# Patient Record
Sex: Female | Born: 1960 | Race: White | Hispanic: No | Marital: Married | Smoking: Current every day smoker
Health system: Southern US, Community
[De-identification: ages and names within clinical notes are randomized; demographics above are authoritative.]

## PROBLEM LIST (undated history)

## (undated) DIAGNOSIS — N879 Dysplasia of cervix uteri, unspecified: Secondary | ICD-10-CM

## (undated) DIAGNOSIS — I10 Essential (primary) hypertension: Secondary | ICD-10-CM

## (undated) DIAGNOSIS — E7800 Pure hypercholesterolemia, unspecified: Secondary | ICD-10-CM

## (undated) DIAGNOSIS — N83209 Unspecified ovarian cyst, unspecified side: Secondary | ICD-10-CM

## (undated) DIAGNOSIS — F419 Anxiety disorder, unspecified: Secondary | ICD-10-CM

## (undated) HISTORY — PX: TYMPANOSTOMY TUBE PLACEMENT: SHX32

## (undated) HISTORY — DX: Anxiety disorder, unspecified: F41.9

## (undated) HISTORY — PX: PELVIC LAPAROSCOPY: SHX162

## (undated) HISTORY — DX: Dysplasia of cervix uteri, unspecified: N87.9

## (undated) HISTORY — DX: Pure hypercholesterolemia, unspecified: E78.00

## (undated) HISTORY — PX: COLPOSCOPY: SHX161

## (undated) HISTORY — DX: Unspecified ovarian cyst, unspecified side: N83.209

## (undated) HISTORY — PX: OTHER SURGICAL HISTORY: SHX169

## (undated) HISTORY — PX: GYNECOLOGIC CRYOSURGERY: SHX857

---

## 1997-12-12 ENCOUNTER — Other Ambulatory Visit: Admission: RE | Admit: 1997-12-12 | Discharge: 1997-12-12 | Payer: Self-pay | Admitting: *Deleted

## 1998-12-26 ENCOUNTER — Other Ambulatory Visit: Admission: RE | Admit: 1998-12-26 | Discharge: 1998-12-26 | Payer: Self-pay | Admitting: *Deleted

## 1999-01-10 ENCOUNTER — Encounter: Payer: Self-pay | Admitting: *Deleted

## 1999-01-10 ENCOUNTER — Ambulatory Visit (HOSPITAL_COMMUNITY): Admission: RE | Admit: 1999-01-10 | Discharge: 1999-01-10 | Payer: Self-pay | Admitting: *Deleted

## 1999-12-07 ENCOUNTER — Ambulatory Visit (HOSPITAL_COMMUNITY): Admission: RE | Admit: 1999-12-07 | Discharge: 1999-12-07 | Payer: Self-pay | Admitting: *Deleted

## 1999-12-31 ENCOUNTER — Other Ambulatory Visit: Admission: RE | Admit: 1999-12-31 | Discharge: 1999-12-31 | Payer: Self-pay | Admitting: Obstetrics and Gynecology

## 2000-02-12 HISTORY — PX: ABDOMINAL SURGERY: SHX537

## 2000-02-12 HISTORY — PX: OOPHORECTOMY: SHX86

## 2000-07-14 ENCOUNTER — Encounter (INDEPENDENT_AMBULATORY_CARE_PROVIDER_SITE_OTHER): Payer: Self-pay | Admitting: Specialist

## 2000-07-14 ENCOUNTER — Inpatient Hospital Stay (HOSPITAL_COMMUNITY): Admission: RE | Admit: 2000-07-14 | Discharge: 2000-07-16 | Payer: Self-pay | Admitting: Obstetrics and Gynecology

## 2000-12-13 ENCOUNTER — Inpatient Hospital Stay (HOSPITAL_COMMUNITY): Admission: AD | Admit: 2000-12-13 | Discharge: 2000-12-13 | Payer: Self-pay | Admitting: Obstetrics and Gynecology

## 2001-01-08 ENCOUNTER — Inpatient Hospital Stay (HOSPITAL_COMMUNITY): Admission: AD | Admit: 2001-01-08 | Discharge: 2001-01-08 | Payer: Self-pay | Admitting: Obstetrics and Gynecology

## 2001-01-09 ENCOUNTER — Inpatient Hospital Stay (HOSPITAL_COMMUNITY): Admission: AD | Admit: 2001-01-09 | Discharge: 2001-01-09 | Payer: Self-pay | Admitting: Obstetrics and Gynecology

## 2001-01-12 ENCOUNTER — Inpatient Hospital Stay (HOSPITAL_COMMUNITY): Admission: AD | Admit: 2001-01-12 | Discharge: 2001-01-12 | Payer: Self-pay | Admitting: Obstetrics and Gynecology

## 2001-03-08 ENCOUNTER — Inpatient Hospital Stay (HOSPITAL_COMMUNITY): Admission: AD | Admit: 2001-03-08 | Discharge: 2001-03-08 | Payer: Self-pay | Admitting: Obstetrics and Gynecology

## 2001-04-18 ENCOUNTER — Inpatient Hospital Stay (HOSPITAL_COMMUNITY): Admission: AD | Admit: 2001-04-18 | Discharge: 2001-04-18 | Payer: Self-pay | Admitting: Gynecology

## 2001-05-13 ENCOUNTER — Other Ambulatory Visit: Admission: RE | Admit: 2001-05-13 | Discharge: 2001-05-13 | Payer: Self-pay | Admitting: Obstetrics and Gynecology

## 2001-06-10 ENCOUNTER — Encounter: Admission: RE | Admit: 2001-06-10 | Discharge: 2001-09-08 | Payer: Self-pay | Admitting: Obstetrics and Gynecology

## 2001-07-30 ENCOUNTER — Ambulatory Visit (HOSPITAL_COMMUNITY): Admission: RE | Admit: 2001-07-30 | Discharge: 2001-07-30 | Payer: Self-pay | Admitting: Obstetrics and Gynecology

## 2001-07-30 ENCOUNTER — Encounter: Payer: Self-pay | Admitting: Obstetrics and Gynecology

## 2002-12-21 ENCOUNTER — Ambulatory Visit (HOSPITAL_COMMUNITY): Admission: RE | Admit: 2002-12-21 | Discharge: 2002-12-21 | Payer: Self-pay | Admitting: Obstetrics and Gynecology

## 2003-02-17 ENCOUNTER — Other Ambulatory Visit: Admission: RE | Admit: 2003-02-17 | Discharge: 2003-02-17 | Payer: Self-pay | Admitting: Obstetrics and Gynecology

## 2004-05-10 ENCOUNTER — Ambulatory Visit (HOSPITAL_COMMUNITY): Admission: RE | Admit: 2004-05-10 | Discharge: 2004-05-10 | Payer: Self-pay | Admitting: Obstetrics and Gynecology

## 2004-05-23 ENCOUNTER — Other Ambulatory Visit: Admission: RE | Admit: 2004-05-23 | Discharge: 2004-05-23 | Payer: Self-pay | Admitting: Obstetrics and Gynecology

## 2005-05-31 ENCOUNTER — Ambulatory Visit (HOSPITAL_COMMUNITY): Admission: RE | Admit: 2005-05-31 | Discharge: 2005-05-31 | Payer: Self-pay | Admitting: Obstetrics and Gynecology

## 2005-06-06 ENCOUNTER — Other Ambulatory Visit: Admission: RE | Admit: 2005-06-06 | Discharge: 2005-06-06 | Payer: Self-pay | Admitting: Obstetrics and Gynecology

## 2006-08-07 ENCOUNTER — Ambulatory Visit (HOSPITAL_COMMUNITY): Admission: RE | Admit: 2006-08-07 | Discharge: 2006-08-07 | Payer: Self-pay | Admitting: Obstetrics and Gynecology

## 2006-08-14 ENCOUNTER — Other Ambulatory Visit: Admission: RE | Admit: 2006-08-14 | Discharge: 2006-08-14 | Payer: Self-pay | Admitting: Obstetrics and Gynecology

## 2007-01-09 ENCOUNTER — Emergency Department (HOSPITAL_COMMUNITY): Admission: EM | Admit: 2007-01-09 | Discharge: 2007-01-09 | Payer: Self-pay | Admitting: Emergency Medicine

## 2007-12-23 ENCOUNTER — Encounter: Payer: Self-pay | Admitting: Obstetrics and Gynecology

## 2007-12-23 ENCOUNTER — Other Ambulatory Visit: Admission: RE | Admit: 2007-12-23 | Discharge: 2007-12-23 | Payer: Self-pay | Admitting: Obstetrics and Gynecology

## 2007-12-23 ENCOUNTER — Ambulatory Visit: Payer: Self-pay | Admitting: Obstetrics and Gynecology

## 2008-03-01 ENCOUNTER — Ambulatory Visit (HOSPITAL_COMMUNITY): Admission: RE | Admit: 2008-03-01 | Discharge: 2008-03-01 | Payer: Self-pay | Admitting: Obstetrics and Gynecology

## 2008-03-24 ENCOUNTER — Ambulatory Visit: Payer: Self-pay | Admitting: Obstetrics and Gynecology

## 2009-03-06 ENCOUNTER — Ambulatory Visit (HOSPITAL_COMMUNITY): Admission: RE | Admit: 2009-03-06 | Discharge: 2009-03-06 | Payer: Self-pay | Admitting: Obstetrics and Gynecology

## 2009-09-21 ENCOUNTER — Other Ambulatory Visit: Admission: RE | Admit: 2009-09-21 | Discharge: 2009-09-21 | Payer: Self-pay | Admitting: Obstetrics and Gynecology

## 2009-09-21 ENCOUNTER — Ambulatory Visit: Payer: Self-pay | Admitting: Obstetrics and Gynecology

## 2010-04-02 ENCOUNTER — Other Ambulatory Visit: Payer: Self-pay | Admitting: Obstetrics and Gynecology

## 2010-04-02 DIAGNOSIS — Z1231 Encounter for screening mammogram for malignant neoplasm of breast: Secondary | ICD-10-CM

## 2010-04-09 ENCOUNTER — Ambulatory Visit (HOSPITAL_COMMUNITY): Payer: BC Managed Care – PPO

## 2010-04-16 ENCOUNTER — Ambulatory Visit (HOSPITAL_COMMUNITY)
Admission: RE | Admit: 2010-04-16 | Discharge: 2010-04-16 | Disposition: A | Discharge status: Home / Self Care | Payer: BC Managed Care – PPO | Source: Ambulatory Visit | Attending: Obstetrics and Gynecology | Admitting: Obstetrics and Gynecology

## 2010-04-16 DIAGNOSIS — Z1231 Encounter for screening mammogram for malignant neoplasm of breast: Secondary | ICD-10-CM | POA: Insufficient documentation

## 2010-06-29 NOTE — Discharge Summary (Signed)
[facility]  Patient:    Adrienne [name], Adrienne [name]                    MRN: [id number] Adm. Date:  [id number] Disc. Date: [id number] Attending:  Sharon Mt                           Discharge Summary  HISTORY OF PRESENT ILLNESS:   The patient is a 50 year old female who was admitted to the hospital with stage IV endometriosis and pelvic pain for surgery.  HOSPITAL COURSE:              On the day of admission, she was taken to the operating room.  An exploratory laparotomy, lysis of adhesions, left salpingo-oophorectomy, right ovarian cystectomy, and excision of endometriosis were all performed.  Postoperatively, she had an ileus on the first postoperative day.  It responded to restricting fluids and IVs.  By the second postoperative day, she was voiding well, passing gas, and was ready for discharge.  DISCHARGE DIET:               Regular.  DISCHARGE ACTIVITY:           Ambulatory.  DISCHARGE MEDICATIONS:        Tylox and Premarin 0.625 mg one daily to take because of the Lupron Depot we were treating her with postoperatively.  She had gotten an injection of 3.75 mg on July 15, 2000.  FOLLOW-UP:                    She will return to the office in 24 hours for staple removal.  LABORATORY DATA:              The final pathology report is not available at the time of dictation.  DISCHARGE DIAGNOSIS:          Stage IV endometriosis with pelvic pain.  OPERATIONS:                   1. Exploratory laparotomy.                               2. Left salpingo-oophorectomy.                               3. Right ovarian cystectomy.                               4. Lysis of pelvic adhesions. DD:  07/16/00 TD:  07/16/00 Job: [id number]

## 2010-06-29 NOTE — Op Note (Signed)
[facility]  Patient:    Adrienne [name], Adrienne [name]                    MRN: [id number] Proc. Date: 07/14/00 Adm. Date:  [id number] Attending:  Sharon Mt                           Operative Report  PREOPERATIVE DIAGNOSES:       1. Pelvic pain.                               2. Stage IV endometriosis.  POSTOPERATIVE DIAGNOSES:      1. Pelvic pain.                               2. Stage IV endometriosis.  OPERATION:                    Exploratory laparotomy with lysis of                               pelvic adhesions, left salpingo-oophorectomy,                               excision of endometrium of right ovary,                               excision of endometriosis.  SURGEON:                      Daniel L. Eda Paschal, M.D.  ASSISTANTNadyne Coombes. Fontaine, M.D.  ANESTHESIA:  ESTIMATED BLOOD LOSS:         250 cc.  FINDINGS:                     At the time of laparotomy, the patient had a completely obliterated cul-de-sac from endometriosis. Both adnexa were densely adherent into the cul-de-sac by endometriosis.  Once they were freed up, the left adnexa contained a 4 to 5 cm endometrioma.  There was almost no free ovarian tissue that was not involved with endometriosis.  Blood supply was significantly altered by freeing up the left adnexa.  Left fallopian tube had fimbria but was not a normal left fallopian tube. On the right side, there was a 1 cm endometrioma.  It was surface endometriosis. There were pelvic adhesions; however, when all the above was freed up, she had a much healthier right ovary.  There was endometriosis in the cul-de-sac. There was also endometriosis in the vesicouterine fold of peritoneum including nodules of endometriosis. The ileocecal junction was identified and the patient had a normal appendix.  The fallopian tube on the right was normal with luxuriant fimbria.  DESCRIPTION OF PROCEDURE:     After  adequate general endotracheal anesthesia, the patient was placed in the supine position, prepped and draped in the usual sterile manner.  A Foley catheter was inserted in the patients bladder.  A Pfannenstiel incision was made.  The fascia was opened transversely. The peritoneum was entered vertically.  From this point on, microsurgical technique was utilized, first adhesions were lysed to free up the adnexa, then the endometrioma of the right ovary was excised using the Morton microelectrode.  Copious irrigation was done with Ringers lactate through an irrigator rather than using sponges.  All surface endometriosis on the right adnexa was freed up and the right ovarian capsule was closed with a running 4-0 Prolene burying the knot.  ON the left side there was a much different story.  The adnexa was densely adherent to the cul-de-sac and to the broad ligament and as it was freed up, vascularity was compromised significantly and the tissue that was left other than the endometrioma was also very poor tissue and did not appear salvageable. As had been discussed with the patient preoperatively, it was felt at this point a left salpingo-oophorectomy would serve her better in terms of preventing recurrent disease.  The round ligament on the left was clamped, cut, and suture ligated with 0 Vicryl.  The retroperitoneal area was then entered on the left.  Ureter was identified. The infundibulopelvic ligament was clamped, cut, and doubly suture ligated with 0 Vicryl as was the utero-ovarian ligament and fallopian tube.  The adnexa was removed.  The capsule had to be removed in pieces to completely remove it plus the endometriosis. There was some oozing that was controlled with the Bovie without compromising the ureter. Finally all the bleeding had stopped. The cul-de-sac endometriosis was also sharply excised. The endometriosis on the vesicouterine fold of peritoneum was sharply incised without  entering the bladder and nodules of endometriosis were removed.  The ileocecal junction was identified and the appendix was normal.  Finally all the above had been accomplished.  Copious irrigation was done with Ringers lactate. There was no further bleeding.  The peritoneum was closed with a running 0 Vicryl.  Prior to completely closing the peritoneum, ____________ was placed in the peritoneum to prevent adhesion formation.  Copious irrigation was done sub- and superfascially with Ringers lactate and the fascia was closed with two running 0 Vicryls.  The skin was closed with staples.  The estimated blood loss for the entire procedure was 250 cc with none replaced.  The patient tolerated the procedure well and left the operating room in satisfactory condition draining clear urine from her Foley catheter. DD:  07/14/00 TD:  07/14/00 Job: [id number]

## 2010-06-29 NOTE — H&P (Signed)
[facility] of [facility]  Patient:    Adrienne [name], Adrienne [name]                    MRN: [id number] Adm. Date:  [id number] Attending:  Donne Hazel                         History and Physical  HISTORY OF PRESENT ILLNESS:   [name] is a 50 year old female, gravida 0 admitted for laparoscopy due to recurrent pelvic pain and recurrent ovarian cyst.  The patient has had multiple emergency room visits for severe pelvic pain, is admitted for evaluation and treatment of this pain.  She has been trying to conceive for quite a while.  She has been unsuccessful despite some effort in fertility.  However, her admission is most notably for her severe pelvic pain.  PAST MEDICAL HISTORY:         1. History of recurrent chronic and acute pelvic                                  pain.                               2. Mitral valve prolapse.  PAST SURGICAL HISTORY:        1. Ear surgery x 3.                               2. Neck surgery x 1.  CURRENT MEDICATIONS:          Xanax p.r.n.  ALLERGIES:                    None known.  PHYSICAL EXAMINATION:  VITAL SIGNS:                  Stable, temperature 97, pulse 80, respirations 16, blood pressure 127/79.  GENERAL:                      She is a well-developed, well-nourished female in no acute distress.  HEENT:                        Within normal limits.  NECK:                         Supple without adenopathy or thyromegaly.  HEART:                        Regular rate and rhythm without murmur, gallop, or rub.  LUNGS:                        Clear to auscultation.  BREASTS:                      Examination done recently in the office was normal but is deferred upon admission.  ABDOMEN:                      Benign without masses, tenderness, organomegaly, or hernia.  EXTREMITIES:                  Grossly  normal.  NEUROLOGICAL:                 Grossly normal.  PELVIC:                       Examination normal  external female genitalia. Vagina, cervix clear.  The uterus is small, nontender and mobile.  The adnexa is clear of mass.  ADMISSION DIAGNOSES:          1. Chronic and acute pelvic pain.                               2. History of recurrent ovarian cyst.  PLAN:                         Diagnostic laparoscopy.  DISCUSSION:                   The risks and benefits of surgery were explained to the patient.  The risks of bleeding, infection, risk of injury to surrounding organs explained.  The patient was allowed to ask questions and wished to proceed with diagnostic laparoscopy. DD:  12/07/99 TD:  12/07/99 Job: [id number]

## 2010-06-29 NOTE — H&P (Signed)
[facility] of [facility]  Patient:    Adrienne [name], Adrienne [name]                      MRN: [id number] Attending:  Rande Brunt. Eda Paschal, M.D.                         History and Physical  CHIEF COMPLAINT:              Pelvic pain.  HISTORY OF PRESENT ILLNESS:   The patient is a 50 year old gravida 1, para 0, AB 1 with a 2-3 year history of progressively increasing pelvic pain.  It usually occurs mid cycle.  She has had recurrent ovarian cysts associated with it that seem to come and go but it seems to be there even when she is not having it.  Her periods are every 31-32 days.  She is having significant dysmenorrhea but not having any dyspareunia.  On examination, she has significant nodularity of her uterosacral ligaments.  She had undergone a previous laparoscopy by Dr. Malachy Mood in October 2001 and was found to have a stage IV endometriosis.  She now enters the hospital for conservative surgery for the above because she would like to conceive.  She will not undergo a hysterectomy.  She has, however, given me permission to do a unilateral salpingo-oophorectomy if I think that is appropriate.  Ultrasound done last week does show a 4 cm cyst of her left ovary that could be consistent with an endometrioma and a 1 cm cyst of her right ovary, also consistent with a small endometrioma.  PAST MEDICAL HISTORY:         Laparoscopy in 2001.  CURRENT MEDICATIONS:          Nonsteroidal anti-inflammatory drugs.  ALLERGIES:                    None.  FAMILY HISTORY:               She is adopted, therefore no family history obtainable.  SOCIAL HISTORY:               She does smoke and does drink alcohol in moderation.  REVIEW OF SYSTEMS:            HEENT:  Negative.  CARDIAC:  Palpitations and she has a history of mitral valve prolapse.  GI:  Negative.  RESPIRATORY: Negative.  GU:  Negative.  NEUROMUSCULAR:  Negative.  ENDOCRINE:  Negative.  PHYSICAL EXAMINATION:  GENERAL:                       The patient is a well-developed and well-nourished female in no acute distress.  VITAL SIGNS:                  Blood pressure 130/70, pulse 80 and regular, respirations 16 and nonlabored.  She is afebrile.  HEENT:                        All within normal limits.  NECK:                         Supple.  Trachea in the midline.  Thyroid is not enlarged.  LUNGS:                        Clear to P&A.  HEART:                        No thrills, heaves, or murmurs.  BREASTS:                      No masses.  ABDOMEN:                      Soft without guarding, rebound, or masses.  PELVIC:                       External and vaginal within normal limits. Cervix is clean.  Pap smear shows no atypia.  Uterus is mid position.  Normal size and shape.  Somewhat fixed with significant uterosacral nodularity bilaterally.  Adnexa are difficult to examine due to tenderness but she does have a cyst on her left ovary on ultrasound.  Rectovaginal is confirmatory.  EXTREMITIES:                  Within normal limits.  IMPRESSION:                   Pelvic pain with stage IV endometriosis.  PLAN:                         Conservative surgery for the above. DD:  07/14/00 TD:  07/14/00 Job: [id number]

## 2010-06-29 NOTE — Op Note (Signed)
[facility]  Patient:    Adrienne [name], Adrienne [name]                    MRN: [id number] Proc. Date: 12/07/99 Adm. Date:  [id number] Disc. Date: [id number] Attending:  Donne Hazel                           Operative Report  PREOPERATIVE DIAGNOSES:       1. Chronic and acute pelvic pain.                               2. Recurrent ovarian cyst.  POSTOPERATIVE DIAGNOSES:      1. Severe pelvic adhesions.                               2. Endometriosis.  PROCEDURE:                    Laparoscopy.  SURGEON:                      Willey Blade, M.D.  ANESTHESIA:                   General endotracheal.  ESTIMATED BLOOD LOSS:         Less than 20 cc.  COMPLICATIONS:                None.  FINDINGS:                     At the time of laparoscopy, severe pelvic adhesions were noted.  The posterior cul-de-sac was especially diseased, with dense scar tissue.  It was decided at this time not to undergo a dissection of the pelvis, due to the severity of the illness.  A few representative pictures were taken regarding her pelvis.  Basically her posterior cul-de-sac was obliterated from adhesive disease.  DESCRIPTION OF PROCEDURE:     The patient was taken to the operating room where a general endotracheal anesthetic was administered.  The patient was placed on the operating room table in the dorsal lithotomy position.  The abdomen, perineum, and vagina were prepped and draped in the usual sterile fashion with Betadine and sterile drapes.  A red rubber catheter was used to empty the bladder.  Next, a small vertical infraumbilical skin incision was made through which a Veress needle was inserted atraumatically into the abdominal cavity.  The abdomen was insufflated with 2 L of carbon dioxide gas. The Veress needle was removed and a disposable laparoscopic trocar was inserted atraumatically into the abdominal cavity.  A laparoscope was then inserted with the  above-noted findings.  An accessory incision was made two finger breadths above the pubic symphysis and in the midline.  Through this a 5.0 mm trocar was placed atraumatically.  On this particular examination of the pelvis, severe pelvic adhesions were noted.  There was endometriosis apparent, but not biopsied.  The pelvis was in such condition that dissection was not carried out.  A few representative pictures were obtained.  One area on the left was cauterized, due to its hemorrhagic appearance.  All abdominal instruments were removed and the gas was then allowed to escape from the abdomen.  The two small incisions were then closed, first #0 Vicryl on  the umbilical site on the muscle fascia, and the skin was reapproximated with multiple interrupted sutures of #3-0 Vicryl Rapide.  A Hulka tenaculum was placed at the time of the prep, and this was removed at the end of the procedure.  The patient was then awakened, extubated, and taken to the recovery room in good condition.  There were no perioperative complications. DD:  12/28/99 TD:  12/28/99 Job: [id number]

## 2010-11-20 LAB — CBC
HCT: 39.4
Hemoglobin: 13.8
MCV: 96.6
Platelets: 335
RDW: 13.8

## 2010-11-20 LAB — DIFFERENTIAL
Lymphocytes Relative: 32
Lymphs Abs: 3.3
Monocytes Absolute: 0.7
Monocytes Relative: 7
Neutro Abs: 5.8

## 2010-11-20 LAB — URINALYSIS, ROUTINE W REFLEX MICROSCOPIC
Nitrite: NEGATIVE
Protein, ur: NEGATIVE
Urobilinogen, UA: 0.2

## 2010-11-20 LAB — COMPREHENSIVE METABOLIC PANEL
ALT: 26
AST: 29
Albumin: 4
Alkaline Phosphatase: 66
Chloride: 97
Creatinine, Ser: 0.99
Glucose, Bld: 101 — ABNORMAL HIGH
Potassium: 4.1
Total Bilirubin: 0.8

## 2010-11-22 DIAGNOSIS — G43009 Migraine without aura, not intractable, without status migrainosus: Secondary | ICD-10-CM | POA: Insufficient documentation

## 2010-11-22 DIAGNOSIS — F419 Anxiety disorder, unspecified: Secondary | ICD-10-CM | POA: Insufficient documentation

## 2010-12-05 ENCOUNTER — Encounter: Payer: Self-pay | Admitting: Obstetrics and Gynecology

## 2010-12-05 ENCOUNTER — Ambulatory Visit (INDEPENDENT_AMBULATORY_CARE_PROVIDER_SITE_OTHER): Payer: BC Managed Care – PPO | Admitting: Obstetrics and Gynecology

## 2010-12-05 ENCOUNTER — Other Ambulatory Visit (HOSPITAL_COMMUNITY)
Admission: RE | Admit: 2010-12-05 | Discharge: 2010-12-05 | Disposition: A | Discharge status: Home / Self Care | Payer: BC Managed Care – PPO | Source: Ambulatory Visit | Attending: Obstetrics and Gynecology | Admitting: Obstetrics and Gynecology

## 2010-12-05 VITALS — BP 140/90 | Ht 67.0 in | Wt 168.0 lb

## 2010-12-05 DIAGNOSIS — Z780 Asymptomatic menopausal state: Secondary | ICD-10-CM

## 2010-12-05 DIAGNOSIS — N83209 Unspecified ovarian cyst, unspecified side: Secondary | ICD-10-CM | POA: Insufficient documentation

## 2010-12-05 DIAGNOSIS — N951 Menopausal and female climacteric states: Secondary | ICD-10-CM

## 2010-12-05 DIAGNOSIS — Z01419 Encounter for gynecological examination (general) (routine) without abnormal findings: Secondary | ICD-10-CM | POA: Insufficient documentation

## 2010-12-05 DIAGNOSIS — N912 Amenorrhea, unspecified: Secondary | ICD-10-CM

## 2010-12-05 DIAGNOSIS — N949 Unspecified condition associated with female genital organs and menstrual cycle: Secondary | ICD-10-CM

## 2010-12-05 DIAGNOSIS — N809 Endometriosis, unspecified: Secondary | ICD-10-CM | POA: Insufficient documentation

## 2010-12-05 DIAGNOSIS — N938 Other specified abnormal uterine and vaginal bleeding: Secondary | ICD-10-CM

## 2010-12-05 DIAGNOSIS — R82998 Other abnormal findings in urine: Secondary | ICD-10-CM

## 2010-12-05 NOTE — Progress Notes (Signed)
See me today for her annual GYN exam. She has a history of endometriosis. She has had menorrhagia for the past several years. We discussed endometrial ablation in the past. She was at the point that she thought she was going to proceed. She then got busy and did not followup. After her periods have been very regular she bled the whole month of August. She then had amenorrhea until last night when she started spotting she still spotting today. She had microscopic hematuria on one sample but when she returned it was negative. Since she's now spotting she is concerned that this will not be a good sample she gave [place]. She is up-to-date on mammograms. She's having no pelvic pain. She thought she might be menopausal because of the amenorrhea, some hot flashes, and several episodes of a migraine aura without an actual headache.  ROS: Negative except as noted above.  Physical examination: Kennon Portela present HEENT within normal limits. Neck: Thyroid not large. No masses. Supraclavicular nodes: not enlarged. Breasts: Examined in both sitting midline position. No skin changes and no masses. Abdomen: Soft no guarding rebound or masses or hernia. Pelvic: External: Within normal limits. BUS: Within normal limits. Vaginal:within normal limits. Good estrogen effect. No evidence of cystocele rectocele or enterocele. Cervix: clean. Uterus: Normal size and shape. Adnexa: No masses. Rectovaginal exam: Confirmatory and negative. Extremities: Within normal limits.  Assessment: #1. Menorrhagia #2. Dysfunctional uterine bleeding.  Plan: Endometrial biopsy obtained. FSH drawn. We will inform patient with results. Patient would like to proceed with endometrial ablation if menorrhagia persists. Patient is not using birth control because of infertility but I warned her today that she is taking some risk and she would be safer using condoms.

## 2010-12-12 MED ORDER — MEDROXYPROGESTERONE ACETATE 10 MG PO TABS: 10.0000 mg | ORAL_TABLET | Freq: Every day | ORAL | Status: DC

## 2010-12-12 NOTE — Progress Notes (Signed)
Addended by: Richardson Chiquito on: 12/12/2010 01:16 PM   Modules accepted: Orders

## 2011-09-09 ENCOUNTER — Other Ambulatory Visit: Payer: Self-pay | Admitting: Obstetrics and Gynecology

## 2011-09-09 DIAGNOSIS — Z1231 Encounter for screening mammogram for malignant neoplasm of breast: Secondary | ICD-10-CM

## 2011-09-26 ENCOUNTER — Ambulatory Visit (HOSPITAL_COMMUNITY)
Admission: RE | Admit: 2011-09-26 | Discharge: 2011-09-26 | Disposition: A | Discharge status: Home / Self Care | Payer: BC Managed Care – PPO | Source: Ambulatory Visit | Attending: Obstetrics and Gynecology | Admitting: Obstetrics and Gynecology

## 2011-09-26 DIAGNOSIS — Z1231 Encounter for screening mammogram for malignant neoplasm of breast: Secondary | ICD-10-CM | POA: Insufficient documentation

## 2011-11-15 ENCOUNTER — Emergency Department (HOSPITAL_COMMUNITY)
Admission: EM | Admit: 2011-11-15 | Discharge: 2011-11-15 | Disposition: A | Discharge status: Home / Self Care | Payer: BC Managed Care – PPO | Attending: Emergency Medicine | Admitting: Emergency Medicine

## 2011-11-15 ENCOUNTER — Encounter (HOSPITAL_COMMUNITY): Payer: Self-pay

## 2011-11-15 DIAGNOSIS — F17200 Nicotine dependence, unspecified, uncomplicated: Secondary | ICD-10-CM | POA: Insufficient documentation

## 2011-11-15 DIAGNOSIS — E7800 Pure hypercholesterolemia, unspecified: Secondary | ICD-10-CM | POA: Insufficient documentation

## 2011-11-15 DIAGNOSIS — F411 Generalized anxiety disorder: Secondary | ICD-10-CM | POA: Insufficient documentation

## 2011-11-15 DIAGNOSIS — G43109 Migraine with aura, not intractable, without status migrainosus: Secondary | ICD-10-CM

## 2011-11-15 NOTE — ED Provider Notes (Signed)
History     CSN: [id number]  Arrival date & time 11/15/11  1228   First MD Initiated Contact with Patient 11/15/11 1323      Chief Complaint  Patient presents with  . Aphasia    (Consider location/radiation/quality/duration/timing/severity/associated sxs/prior treatment) The history is provided by the patient.   patient had an episode lasted about 10 minutes where she could not speak. She states she had trouble getting the words out and may have had some trouble thinking of them. She has a history of migraine auras. She states that she take a Xanax and some ibuprofen and she does not have a headache. She's had a previous episode with the aura that she got difficulty speaking also. She feels much better now. She states her blood pressure was elevated. No localizing numbness or weakness. She is now at her baseline.  Past Medical History  Diagnosis Date  . Migraine with aura 12/2006    COULD NOT SPEAK, pt has aura without migraines  . Anxiety     EFFEXOR-DR. FRED WILSON  . Elevated cholesterol   . Ovarian cyst   . Endometriosis     Past Surgical History  Procedure Date  . Bronchial cleft cyst   . Pelvic laparoscopy   . Abdominal surgery 2002    EXPLORATORY LAPAROTOMY, LSO , RIGHT OV CYSTECTOMY  . Oophorectomy 2002    LSO, R OVARIAN CYSTECTOMY, EXPLOR LAPAROT  . Tympanostomy tube placement     Family History  Problem Relation Age of Onset  . Adopted: Yes    History  Substance Use Topics  . Smoking status: Current Every Day Smoker -- 0.5 packs/day    Types: Cigarettes  . Smokeless tobacco: Not on file  . Alcohol Use: 2.5 oz/week    5 drink(s) per week    OB History    Grav Para Term Preterm Abortions TAB SAB Ect Mult Living   1 0   1     0      Review of Systems  Constitutional: Negative for chills.  HENT: Negative for neck stiffness.   Respiratory: Negative for chest tightness.   Cardiovascular: Negative for chest pain.  Musculoskeletal: Negative for  arthralgias.  Neurological: Positive for speech difficulty. Negative for light-headedness, numbness and headaches.  Hematological: Negative for adenopathy.    Allergies  Morphine and related  Home Medications   Current Outpatient Rx  Name Route Sig Dispense Refill  . ALPRAZOLAM 0.25 MG PO TABS Oral Take 0.25 mg by mouth 3 (three) times daily as needed. For anxiety.    . IBUPROFEN 200 MG PO TABS Oral Take 400 mg by mouth every 8 (eight) hours as needed. For pain.    . ADULT MULTIVITAMIN W/MINERALS CH Oral Take 1 tablet by mouth daily.    . VENLAFAXINE HCL ER 75 MG PO CP24 Oral Take 75 mg by mouth daily.      BP 143/97  Pulse 85  Temp 97.5 F (36.4 C) (Oral)  Resp 20  Ht 5\' 8"  (1.727 m)  Wt 160 lb (72.576 kg)  BMI 24.33 kg/m2  SpO2 100%  LMP 09/13/2011  Physical Exam  Constitutional: She is oriented to person, place, and time. She appears well-developed and well-nourished.  HENT:  Head: Normocephalic and atraumatic.  Eyes: Pupils are equal, round, and reactive to light.  Neck: Normal range of motion. Neck supple.  Cardiovascular: Normal rate and regular rhythm.   Pulmonary/Chest: Effort normal and breath sounds normal.  Abdominal: Soft.  Musculoskeletal: Normal  range of motion.  Neurological: She is alert and oriented to person, place, and time. No cranial nerve deficit.  Skin: Skin is warm and dry.    ED Course  Procedures (including critical care time)  Labs Reviewed - No data to display No results found.   1. Complicated migraine       MDM  Patient with difficulty speaking this resolved. History of the same and was diagnosed a couple of migraine. She did have her typical aura no headache. She's back at her baseline now. The stomach out as a CVA. She's back to her baseline and will be followed up as needed with neurology.        Juliet Rude. Rubin Payor, MD 11/15/11 1353

## 2011-11-15 NOTE — ED Notes (Signed)
Pt presents tearful, complaining of inability to get her words out correctly, pt also describes aura like she was going to have a migraine but not having headache, pt took xanax x2 today when s/s started, s/s are subsiding now per pt, pt states that this has happened once before 3 years ago, pt had work up and everything was negative.

## 2011-11-15 NOTE — ED Notes (Signed)
RN at  Bedside

## 2011-12-25 ENCOUNTER — Encounter: Payer: Self-pay | Admitting: Obstetrics and Gynecology

## 2011-12-25 ENCOUNTER — Ambulatory Visit (INDEPENDENT_AMBULATORY_CARE_PROVIDER_SITE_OTHER): Payer: BC Managed Care – PPO | Admitting: Obstetrics and Gynecology

## 2011-12-25 ENCOUNTER — Other Ambulatory Visit (HOSPITAL_COMMUNITY)
Admission: RE | Admit: 2011-12-25 | Discharge: 2011-12-25 | Disposition: A | Discharge status: Home / Self Care | Payer: BC Managed Care – PPO | Source: Ambulatory Visit | Attending: Obstetrics and Gynecology | Admitting: Obstetrics and Gynecology

## 2011-12-25 VITALS — BP 154/102 | Ht 67.0 in | Wt 169.0 lb

## 2011-12-25 DIAGNOSIS — Z1151 Encounter for screening for human papillomavirus (HPV): Secondary | ICD-10-CM | POA: Insufficient documentation

## 2011-12-25 DIAGNOSIS — N879 Dysplasia of cervix uteri, unspecified: Secondary | ICD-10-CM | POA: Insufficient documentation

## 2011-12-25 DIAGNOSIS — Z01419 Encounter for gynecological examination (general) (routine) without abnormal findings: Secondary | ICD-10-CM

## 2011-12-25 NOTE — Patient Instructions (Signed)
Take Provera for 5 days.

## 2011-12-25 NOTE — Progress Notes (Signed)
Patient came to see me today for her annual GYN exam. She continues to have oligomenorrhea. Her last menstrual cycle was in August. Last year she went extend the time without a cycle and we did an endometrial biopsy to rule out hyperplasia. She did Provera withdrawal and had a cycle. She knew to continue Provera withdrawal when she didn't have a cycle for 60 days but is not done recently. 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 Her Pap smears have been normal yearly since then. Her last Pap smear was 2012. She is up-to-date on mammograms. Her blood pressures Have been elevated on several occasions including today. She had an appointment with Dr. Andrey Campanile which he canceled but she will reschedule within the next week. In 2002 she had a laparotomy with a left salpingo-oophorectomy and a right ovarian cystectomy for endometriosis.  Physical examination:kim gardner present. HEENT within normal limits. Neck: Thyroid not large. No masses. Supraclavicular nodes: not enlarged. Breasts: Examined in both sitting and lying  position. No skin changes and no masses. Abdomen: Soft no guarding rebound or masses or hernia. Pelvic: External: Within normal limits. BUS: Within normal limits. Vaginal:within normal limits. Good estrogen effect. No evidence of cystocele rectocele or enterocele. Cervix: clean. Uterus: Normal size and shape. Adnexa: No masses. Rectovaginal exam: Confirmatory and negative. Extremities: Within normal limits.  Assessment: #1. Hypertension #2. Endometriosis #3. History of cervical dysplasia  Plan: She will see Dr. Andrey Campanile within the week. She will continue yearly mammograms.The new Pap smear guidelines were discussed with the patient. Pap and high-risk HPV typing done.

## 2011-12-26 LAB — URINALYSIS W MICROSCOPIC + REFLEX CULTURE
Bacteria, UA: NONE SEEN
Casts: NONE SEEN
Crystals: NONE SEEN
Glucose, UA: NEGATIVE mg/dL
Hgb urine dipstick: NEGATIVE
Nitrite: NEGATIVE
Specific Gravity, Urine: 1.01 (ref 1.005–1.030)
Urobilinogen, UA: 0.2 mg/dL (ref 0.0–1.0)

## 2012-01-15 ENCOUNTER — Telehealth: Payer: Self-pay | Admitting: *Deleted

## 2012-01-15 DIAGNOSIS — N912 Amenorrhea, unspecified: Secondary | ICD-10-CM

## 2012-01-15 NOTE — Telephone Encounter (Signed)
Pt was seen on 12/25/11 given rx for provera tablet to take for 5 days, she completed medication 2 weeks & 3 days and no cycle yet. Pt asked what is the next step? Please advise

## 2012-01-15 NOTE — Telephone Encounter (Signed)
Pt informed with the below note, pt will come in tomorrow at 3 pm. Order placed.

## 2012-01-15 NOTE — Addendum Note (Signed)
Addended by: Aura Camps on: 01/15/2012 04:00 PM   Modules accepted: Orders

## 2012-01-15 NOTE — Telephone Encounter (Signed)
Have patient come in for [facility].

## 2012-01-16 ENCOUNTER — Other Ambulatory Visit: Payer: BC Managed Care – PPO

## 2012-07-15 ENCOUNTER — Encounter: Payer: Self-pay | Admitting: Women's Health

## 2012-07-15 ENCOUNTER — Ambulatory Visit (INDEPENDENT_AMBULATORY_CARE_PROVIDER_SITE_OTHER): Payer: BC Managed Care – PPO | Admitting: Women's Health

## 2012-07-15 VITALS — BP 124/88

## 2012-07-15 DIAGNOSIS — I10 Essential (primary) hypertension: Secondary | ICD-10-CM | POA: Insufficient documentation

## 2012-07-15 DIAGNOSIS — K649 Unspecified hemorrhoids: Secondary | ICD-10-CM

## 2012-07-15 DIAGNOSIS — N912 Amenorrhea, unspecified: Secondary | ICD-10-CM

## 2012-07-15 MED ORDER — HYDROCORTISONE ACE-PRAMOXINE 2.5-1 % RE CREA: TOPICAL_CREAM | Freq: Three times a day (TID) | RECTAL | Status: DC

## 2012-07-15 NOTE — Progress Notes (Signed)
Patient ID: Adrienne [name], female   DOB: April 18, 1960, 52 y.o.   MRN: [id number] Presents with complaint of small bump at rectal opening for approximately 4 days. Also states amenorrheic for several months, having hot flushes, had an [facility] >1 years ago that was normal. Has used Provera in the past to induce cycles. Denies constipation, vaginal discharge, urinary symptoms, abdominal pain or fever. Was noted to be hypertensive at annual exam 12/2011, now treated for hypertension on Toprol-XL and HCTZ per primary care. Stands a lot with job/teacher.  Exam: Appears well. External genitalia within normal limits, small less than 1 cm firm bump with minimal tenderness noted at rectal opening. Rectal exam no palpable internal hemorrhoids.  Probable small hemorrhoid Amenorrheic  Plan: FSH, if not menopausal will withdrawal with Provera 10 for 10 days. Analpram to 3 times per day for one week if area does not resolve return to office for followup.

## 2012-07-15 NOTE — Patient Instructions (Addendum)
Hemorrhoids Hemorrhoids are swollen veins around the rectum or anus. There are two types of hemorrhoids:   Internal hemorrhoids. These occur in the veins just inside the rectum. They may poke through to the outside and become irritated and painful.  External hemorrhoids. These occur in the veins outside the anus and can be felt as a painful swelling or hard lump near the anus. CAUSES  Pregnancy.   Obesity.   Constipation or diarrhea.   Straining to have a bowel movement.   Sitting for long periods on the toilet.  Heavy lifting or other activity that caused you to strain.  Anal intercourse. SYMPTOMS   Pain.   Anal itching or irritation.   Rectal bleeding.   Fecal leakage.   Anal swelling.   One or more lumps around the anus.  DIAGNOSIS  Your caregiver may be able to diagnose hemorrhoids by visual examination. Other examinations or tests that may be performed include:   Examination of the rectal area with a gloved hand (digital rectal exam).   Examination of anal canal using a small tube (scope).   A blood test if you have lost a significant amount of blood.  A test to look inside the colon (sigmoidoscopy or colonoscopy). TREATMENT Most hemorrhoids can be treated at home. However, if symptoms do not seem to be getting better or if you have a lot of rectal bleeding, your caregiver may perform a procedure to help make the hemorrhoids get smaller or remove them completely. Possible treatments include:   Placing a rubber band at the base of the hemorrhoid to cut off the circulation (rubber band ligation).   Injecting a chemical to shrink the hemorrhoid (sclerotherapy).   Using a tool to burn the hemorrhoid (infrared light therapy).   Surgically removing the hemorrhoid (hemorrhoidectomy).   Stapling the hemorrhoid to block blood flow to the tissue (hemorrhoid stapling).  HOME CARE INSTRUCTIONS   Eat foods with fiber, such as whole grains, beans,  nuts, fruits, and vegetables. Ask your doctor about taking products with added fiber in them (fibersupplements).  Increase fluid intake. Drink enough water and fluids to keep your urine clear or pale yellow.   Exercise regularly.   Go to the bathroom when you have the urge to have a bowel movement. Do not wait.   Avoid straining to have bowel movements.   Keep the anal area dry and clean. Use wet toilet paper or moist towelettes after a bowel movement.   Medicated creams and suppositories may be used or applied as directed.   Only take over-the-counter or prescription medicines as directed by your caregiver.   Take warm sitz baths for 15 20 minutes, 3 4 times a day to ease pain and discomfort.   Place ice packs on the hemorrhoids if they are tender and swollen. Using ice packs between sitz baths may be helpful.   Put ice in a plastic bag.   Place a towel between your skin and the bag.   Leave the ice on for 15 20 minutes, 3 4 times a day.   Do not use a donut-shaped pillow or sit on the toilet for long periods. This increases blood pooling and pain.  SEEK MEDICAL CARE IF:  You have increasing pain and swelling that is not controlled by treatment or medicine.  You have uncontrolled bleeding.  You have difficulty or you are unable to have a bowel movement.  You have pain or inflammation outside the area of the hemorrhoids. MAKE SURE YOU:    Understand these instructions.  Will watch your condition.  Will get help right away if you are not doing well or get worse. Document Released: 01/26/2000 Document Revised: 01/15/2012 Document Reviewed: 12/03/2011 ExitCare Patient Information 2014 [employer].  

## 2012-07-16 LAB — FOLLICLE STIMULATING HORMONE: FSH: 63.8 m[IU]/mL

## 2013-09-16 ENCOUNTER — Other Ambulatory Visit: Payer: Self-pay | Admitting: Women's Health

## 2013-09-16 DIAGNOSIS — Z1231 Encounter for screening mammogram for malignant neoplasm of breast: Secondary | ICD-10-CM

## 2013-09-21 ENCOUNTER — Ambulatory Visit (HOSPITAL_COMMUNITY)
Admission: RE | Admit: 2013-09-21 | Discharge: 2013-09-21 | Disposition: A | Discharge status: Home / Self Care | Payer: BC Managed Care – PPO | Source: Ambulatory Visit | Attending: Women's Health | Admitting: Women's Health

## 2013-09-21 DIAGNOSIS — Z1231 Encounter for screening mammogram for malignant neoplasm of breast: Secondary | ICD-10-CM | POA: Insufficient documentation

## 2013-10-14 ENCOUNTER — Ambulatory Visit (INDEPENDENT_AMBULATORY_CARE_PROVIDER_SITE_OTHER): Payer: BC Managed Care – PPO | Admitting: Women's Health

## 2013-10-14 ENCOUNTER — Encounter: Payer: Self-pay | Admitting: Women's Health

## 2013-10-14 VITALS — BP 126/74 | Ht 66.0 in | Wt 175.0 lb

## 2013-10-14 DIAGNOSIS — Z01419 Encounter for gynecological examination (general) (routine) without abnormal findings: Secondary | ICD-10-CM

## 2013-10-14 DIAGNOSIS — Z780 Asymptomatic menopausal state: Secondary | ICD-10-CM

## 2013-10-14 NOTE — Progress Notes (Signed)
JEFF MCCALLUM April 08, 1960 [id number]    History:    Presents for annual exam.  No bleeding greater than one year, [facility] 07/2012. Reports some hot flushes but able to sleep well. Currently on Effexor per primary care which she states does help with rest. Cryo- in her 21s with normal Paps after, normal mammograms. Hypertension/anxiety and depression managed by primary care. 2002 endometriosis with LSO and right ovarian cystectomy. 07/2013 benign colon polyps repeat in 5 years. Smokes half to one pack daily.  Past medical history, past surgical history, family history and social history were all reviewed and documented in the EPIC chart. Chartered certified accountant. Adopted.  ROS:  A  12 point ROS was performed and pertinent positives and negatives are included.  Exam:  Filed Vitals:   10/14/13 1531  BP: 126/74    General appearance:  Normal Thyroid:  Symmetrical, normal in size, without palpable masses or nodularity. Respiratory  Auscultation:  Clear without wheezing or rhonchi Cardiovascular  Auscultation:  Regular rate, without rubs, murmurs or gallops  Edema/varicosities:  Not grossly evident Abdominal  Soft,nontender, without masses, guarding or rebound.  Liver/spleen:  No organomegaly noted  Hernia:  None appreciated  Skin  Inspection:  Grossly normal   Breasts: Examined lying and sitting.     Right: Without masses, retractions, discharge or axillary adenopathy.     Left: Without masses, retractions, discharge or axillary adenopathy. Gentitourinary   Inguinal/mons:  Normal without inguinal adenopathy  External genitalia:  Normal  BUS/Urethra/Skene's glands:  Normal  Vagina:  Normal  Cervix:  Normal  Uterus:   normal in size, shape and contour.  Midline and mobile  Adnexa/parametria:     Rt: Without masses or tenderness.   Lt: Without masses or tenderness.  Anus and perineum: Normal  Digital rectal exam: Normal sphincter tone without palpated masses or  tenderness  Assessment/Plan:  53 y.o. MWF G1P0 for annual exam.    Postmenopausal/no bleeding/no HRT Normal Paps greater than 20 years 07/2013 benign colon polyps Hypertension/anxiety and depression managed by primary care labs and meds  Plan: Menopause reviewed, denies need for HRT. Aware of importance of decreasing/quitting smoking. SBE's, continue annual mammogram, calcium rich diet, vitamin D 2000 daily encouraged. DEXA will schedule here. Reviewed importance of leisure activities, regular exercise. Pap normal with negative HR HPV 2013, new screening guidelines reviewed.  Note: This dictation was prepared with Dragon/digital dictation.  Any transcriptional errors that result are unintentional. Harrington Challenger [facility], 4:39 PM 10/14/2013

## 2013-10-14 NOTE — Patient Instructions (Signed)
Health Recommendations for Postmenopausal Women Respected and ongoing research has looked at the most common causes of death, disability, and poor quality of life in postmenopausal women. The causes include heart disease, diseases of blood vessels, diabetes, depression, cancer, and bone loss (osteoporosis). Many things can be done to help lower the chances of developing these and other common problems. CARDIOVASCULAR DISEASE Heart Disease: A heart attack is a medical emergency. Know the signs and symptoms of a heart attack. Below are things women can do to reduce their risk for heart disease.   Do not smoke. If you smoke, quit.  Aim for a healthy weight. Being overweight causes many preventable deaths. Eat a healthy and balanced diet and drink an adequate amount of liquids.  Get moving. Make a commitment to be more physically active. Aim for 30 minutes of activity on most, if not all days of the week.  Eat for heart health. Choose a diet that is low in saturated fat and cholesterol and eliminate trans fat. Include whole grains, vegetables, and fruits. Read and understand the labels on food containers before buying.  Know your numbers. Ask your caregiver to check your blood pressure, cholesterol (total, HDL, LDL, triglycerides) and blood glucose. Work with your caregiver on improving your entire clinical picture.  High blood pressure. Limit or stop your table salt intake (try salt substitute and food seasonings). Avoid salty foods and drinks. Read labels on food containers before buying. Eating well and exercising can help control high blood pressure. STROKE  Stroke is a medical emergency. Stroke may be the result of a blood clot in a blood vessel in the brain or by a brain hemorrhage (bleeding). Know the signs and symptoms of a stroke. To lower the risk of developing a stroke:  Avoid fatty foods.  Quit smoking.  Control your diabetes, blood pressure, and irregular heart rate. THROMBOPHLEBITIS  (BLOOD CLOT) OF THE LEG  Becoming overweight and leading a stationary lifestyle may also contribute to developing blood clots. Controlling your diet and exercising will help lower the risk of developing blood clots. CANCER SCREENING  Breast Cancer: Take steps to reduce your risk of breast cancer.  You should practice "breast self-awareness." This means understanding the normal appearance and feel of your breasts and should include breast self-examination. Any changes detected, no matter how small, should be reported to your caregiver.  After age 40, you should have a clinical breast exam (CBE) every year.  Starting at age 40, you should consider having a mammogram (breast X-ray) every year.  If you have a family history of breast cancer, talk to your caregiver about genetic screening.  If you are at high risk for breast cancer, talk to your caregiver about having an MRI and a mammogram every year.  Intestinal or Stomach Cancer: Tests to consider are a rectal exam, fecal occult blood, sigmoidoscopy, and colonoscopy. Women who are high risk may need to be screened at an earlier age and more often.  Cervical Cancer:  Beginning at age 30, you should have a Pap test every 3 years as long as the past 3 Pap tests have been normal.  If you have had past treatment for cervical cancer or a condition that could lead to cancer, you need Pap tests and screening for cancer for at least 20 years after your treatment.  If you had a hysterectomy for a problem that was not cancer or a condition that could lead to cancer, then you no longer need Pap tests.    If you are between ages 65 and 70, and you have had normal Pap tests going back 10 years, you no longer need Pap tests.  If Pap tests have been discontinued, risk factors (such as a new sexual partner) need to be reassessed to determine if screening should be resumed.  Some medical problems can increase the chance of getting cervical cancer. In these  cases, your caregiver may recommend more frequent screening and Pap tests.  Uterine Cancer: If you have vaginal bleeding after reaching menopause, you should notify your caregiver.  Ovarian Cancer: Other than yearly pelvic exams, there are no reliable tests available to screen for ovarian cancer at this time except for yearly pelvic exams.  Lung Cancer: Yearly chest X-rays can detect lung cancer and should be done on high risk women, such as cigarette smokers and women with chronic lung disease (emphysema).  Skin Cancer: A complete body skin exam should be done at your yearly examination. Avoid overexposure to the sun and ultraviolet light lamps. Use a strong sun block cream when in the sun. All of these things are important for lowering the risk of skin cancer. MENOPAUSE Menopause Symptoms: Hormone therapy products are effective for treating symptoms associated with menopause:  Moderate to severe hot flashes.  Night sweats.  Mood swings.  Headaches.  Tiredness.  Loss of sex drive.  Insomnia.  Other symptoms. Hormone replacement carries certain risks, especially in older women. Women who use or are thinking about using estrogen or estrogen with progestin treatments should discuss that with their caregiver. Your caregiver will help you understand the benefits and risks. The ideal dose of hormone replacement therapy is not known. The Food and Drug Administration (FDA) has concluded that hormone therapy should be used only at the lowest doses and for the shortest amount of time to reach treatment goals.  OSTEOPOROSIS Protecting Against Bone Loss and Preventing Fracture If you use hormone therapy for prevention of bone loss (osteoporosis), the risks for bone loss must outweigh the risk of the therapy. Ask your caregiver about other medications known to be safe and effective for preventing bone loss and fractures. To guard against bone loss or fractures, the following is recommended:  If  you are younger than age 50, take 1000 mg of calcium and at least 600 mg of Vitamin D per day.  If you are older than age 50 but younger than age 70, take 1200 mg of calcium and at least 600 mg of Vitamin D per day.  If you are older than age 70, take 1200 mg of calcium and at least 800 mg of Vitamin D per day. Smoking and excessive alcohol intake increases the risk of osteoporosis. Eat foods rich in calcium and vitamin D and do weight bearing exercises several times a week as your caregiver suggests. DIABETES Diabetes Mellitus: If you have type I or type 2 diabetes, you should keep your blood sugar under control with diet, exercise, and recommended medication. Avoid starchy and fatty foods, and too many sweets. Being overweight can make diabetes control more difficult. COGNITION AND MEMORY Cognition and Memory: Menopausal hormone therapy is not recommended for the prevention of cognitive disorders such as Alzheimer's disease or memory loss.  DEPRESSION  Depression may occur at any age, but it is common in elderly women. This may be because of physical, medical, social (loneliness), or financial problems and needs. If you are experiencing depression because of medical problems and control of symptoms, talk to your caregiver about this. Physical   activity and exercise may help with mood and sleep. Community and volunteer involvement may improve your sense of value and worth. If you have depression and you feel that the problem is getting worse or becoming severe, talk to your caregiver about which treatment options are best for you. ACCIDENTS  Accidents are common and can be serious in elderly woman. Prepare your house to prevent accidents. Eliminate throw rugs, place hand bars in bath, shower, and toilet areas. Avoid wearing high heeled shoes or walking on wet, snowy, and icy areas. Limit or stop driving if you have vision or hearing problems, or if you feel you are unsteady with your movements and  reflexes. HEPATITIS C Hepatitis C is a type of viral infection affecting the liver. It is spread mainly through contact with blood from an infected person. It can be treated, but if left untreated, it can lead to severe liver damage over the years. Many people who are infected do not know that the virus is in their blood. If you are a "baby-boomer", it is recommended that you have one screening test for Hepatitis C. IMMUNIZATIONS  Several immunizations are important to consider having during your senior years, including:   Tetanus, diphtheria, and pertussis booster shot.  Influenza every year before the flu season begins.  Pneumonia vaccine.  Shingles vaccine.  Others, as indicated based on your specific needs. Talk to your caregiver about these. Document Released: 03/22/2005 Document Revised: 06/14/2013 Document Reviewed: 11/16/2007 ExitCare Patient Information 2015 [employer]. This information is not intended to replace advice given to you by your health care provider. Make sure you discuss any questions you have with your health care provider.  

## 2013-11-29 ENCOUNTER — Ambulatory Visit (INDEPENDENT_AMBULATORY_CARE_PROVIDER_SITE_OTHER): Payer: BC Managed Care – PPO

## 2013-11-29 ENCOUNTER — Other Ambulatory Visit: Payer: Self-pay | Admitting: Gynecology

## 2013-11-29 DIAGNOSIS — Z780 Asymptomatic menopausal state: Secondary | ICD-10-CM

## 2013-11-29 DIAGNOSIS — Z13820 Encounter for screening for osteoporosis: Secondary | ICD-10-CM

## 2013-12-13 ENCOUNTER — Encounter: Payer: Self-pay | Admitting: Women's Health

## 2015-01-18 ENCOUNTER — Other Ambulatory Visit: Payer: Self-pay

## 2015-01-18 DIAGNOSIS — Z1231 Encounter for screening mammogram for malignant neoplasm of breast: Secondary | ICD-10-CM

## 2015-02-22 ENCOUNTER — Ambulatory Visit
Admission: RE | Admit: 2015-02-22 | Discharge: 2015-02-22 | Disposition: A | Discharge status: Home / Self Care | Payer: BC Managed Care – PPO | Source: Ambulatory Visit

## 2015-02-22 DIAGNOSIS — Z1231 Encounter for screening mammogram for malignant neoplasm of breast: Secondary | ICD-10-CM

## 2015-09-21 DIAGNOSIS — F32A Depression, unspecified: Secondary | ICD-10-CM | POA: Diagnosis present

## 2015-09-21 DIAGNOSIS — F329 Major depressive disorder, single episode, unspecified: Secondary | ICD-10-CM | POA: Diagnosis present

## 2016-06-11 ENCOUNTER — Other Ambulatory Visit: Payer: Self-pay | Admitting: Women's Health

## 2016-06-11 DIAGNOSIS — Z1231 Encounter for screening mammogram for malignant neoplasm of breast: Secondary | ICD-10-CM

## 2016-06-28 ENCOUNTER — Ambulatory Visit
Admission: RE | Admit: 2016-06-28 | Discharge: 2016-06-28 | Disposition: A | Discharge status: Home / Self Care | Payer: BC Managed Care – PPO | Source: Ambulatory Visit | Attending: Women's Health | Admitting: Women's Health

## 2016-06-28 ENCOUNTER — Encounter: Payer: Self-pay | Admitting: Radiology

## 2016-06-28 DIAGNOSIS — Z1231 Encounter for screening mammogram for malignant neoplasm of breast: Secondary | ICD-10-CM

## 2016-09-17 ENCOUNTER — Encounter: Payer: BC Managed Care – PPO | Admitting: Women's Health

## 2016-09-24 ENCOUNTER — Ambulatory Visit (INDEPENDENT_AMBULATORY_CARE_PROVIDER_SITE_OTHER): Payer: BC Managed Care – PPO | Admitting: Women's Health

## 2016-09-24 ENCOUNTER — Encounter: Payer: Self-pay | Admitting: *Deleted

## 2016-09-24 VITALS — BP 122/78 | Ht 66.0 in | Wt 174.0 lb

## 2016-09-24 DIAGNOSIS — R3989 Other symptoms and signs involving the genitourinary system: Secondary | ICD-10-CM

## 2016-09-24 DIAGNOSIS — Z13220 Encounter for screening for lipoid disorders: Secondary | ICD-10-CM

## 2016-09-24 DIAGNOSIS — Z01419 Encounter for gynecological examination (general) (routine) without abnormal findings: Secondary | ICD-10-CM | POA: Diagnosis not present

## 2016-09-24 NOTE — Addendum Note (Signed)
Addended by: Kem ParkinsonBARNES, Fayette Gasner on: 09/24/2016 02:57 PM   Modules accepted: Orders

## 2016-09-24 NOTE — Progress Notes (Signed)
Adrienne [name] 1960/11/12 [id number]    History:    Presents for annual exam. Postmenopausal on no HRT with no bleeding. Cryo-in her 220s with normal Paps after. 2002 LSO for cyst, and right cystectomy, endometriosis. 2015 benign colon polyp 5 year follow-up. Smoker . 2015 normal DEXA. Hypertension and anxiety managed by primary care. History of migraine with aura-rare.  Past medical history, past surgical history, family history and social history were all reviewed and documented in the EPIC chart. 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  ROS:  A ROS was performed and pertinent positives and negatives are included.  Exam:  Vitals:   09/24/16 1201  BP: 122/78  Weight: 174 lb (78.9 kg)  Height: 5\' 6"  (1.676 m)   Body mass index is 28.08 kg/m.   General appearance:  Normal Thyroid:  Symmetrical, normal in size, without palpable masses or nodularity. Respiratory  Auscultation:  Clear without wheezing or rhonchi Cardiovascular  Auscultation:  Regular rate, without rubs, murmurs or gallops  Edema/varicosities:  Not grossly evident Abdominal  Soft,nontender, without masses, guarding or rebound.  Liver/spleen:  No organomegaly noted  Hernia:  None appreciated  Skin  Inspection:  Grossly normal   Breasts: Examined lying and sitting.     Right: Without masses, retractions, discharge or axillary adenopathy.     Left: Without masses, retractions, discharge or axillary adenopathy. Gentitourinary   Inguinal/mons:  Normal without inguinal adenopathy  External genitalia:  Normal  BUS/Urethra/Skene's glands:  Normal  Vagina:  Normal  Cervix:  Normal  Uterus:   normal in size, shape and contour.  Midline and mobile  Adnexa/parametria:     Rt: Without masses or tenderness.   Lt: Without masses or tenderness.  Anus and perineum: Normal  Digital rectal exam: Normal sphincter tone without palpated masses or tenderness  Assessment/Plan:  56 y.o. M WF G1 P0 for annual exam  with complaint of bladder pressure without frequency, pain or burning.  Postmenopausal/no HRT/no bleeding Smoker Hypertension, anxiety-primary care manages labs and meds  Plan: Aware of hazards of smoking, tips to quit reviewed. SBE's, continue annual screening mammogram, calcium rich diet, vitamin D 2000 daily encouraged. Home safety, fall prevention and importance of weightbearing exercise reviewed. Will return to office for fasting lipid panel, not done at primary care. Return to office for ultrasound, UA pending. Pap with HR HPV typing, new screening guidelines reviewed.    Harrington Challengerancy J Matelyn Antonelli [facility] LLCWHNP, 12:47 PM 09/24/2016

## 2016-09-24 NOTE — Patient Instructions (Signed)
Health Maintenance for Postmenopausal Women Menopause is a normal process in which your reproductive ability comes to an end. This process happens gradually over a span of months to years, usually between the ages of 22 and 9. Menopause is complete when you have missed 12 consecutive menstrual periods. It is important to talk with your health care provider about some of the most common conditions that affect postmenopausal women, such as heart disease, cancer, and bone loss (osteoporosis). Adopting a healthy lifestyle and getting preventive care can help to promote your health and wellness. Those actions can also lower your chances of developing some of these common conditions. What should I know about menopause? During menopause, you may experience a number of symptoms, such as:  Moderate-to-severe hot flashes.  Night sweats.  Decrease in sex drive.  Mood swings.  Headaches.  Tiredness.  Irritability.  Memory problems.  Insomnia.  Choosing to treat or not to treat menopausal changes is an individual decision that you make with your health care provider. What should I know about hormone replacement therapy and supplements? Hormone therapy products are effective for treating symptoms that are associated with menopause, such as hot flashes and night sweats. Hormone replacement carries certain risks, especially as you become older. If you are thinking about using estrogen or estrogen with progestin treatments, discuss the benefits and risks with your health care provider. What should I know about heart disease and stroke? Heart disease, heart attack, and stroke become more likely as you age. This may be due, in part, to the hormonal changes that your body experiences during menopause. These can affect how your body processes dietary fats, triglycerides, and cholesterol. Heart attack and stroke are both medical emergencies. There are many things that you can do to help prevent heart disease  and stroke:  Have your blood pressure checked at least every 1-2 years. High blood pressure causes heart disease and increases the risk of stroke.  If you are 53-22 years old, ask your health care provider if you should take aspirin to prevent a heart attack or a stroke.  Do not use any tobacco products, including cigarettes, chewing tobacco, or electronic cigarettes. If you need help quitting, ask your health care provider.  It is important to eat a healthy diet and maintain a healthy weight. ? Be sure to include plenty of vegetables, fruits, low-fat dairy products, and lean protein. ? Avoid eating foods that are high in solid fats, added sugars, or salt (sodium).  Get regular exercise. This is one of the most important things that you can do for your health. ? Try to exercise for at least 150 minutes each week. The type of exercise that you do should increase your heart rate and make you sweat. This is known as moderate-intensity exercise. ? Try to do strengthening exercises at least twice each week. Do these in addition to the moderate-intensity exercise.  Know your numbers.Ask your health care provider to check your cholesterol and your blood glucose. Continue to have your blood tested as directed by your health care provider.  What should I know about cancer screening? There are several types of cancer. Take the following steps to reduce your risk and to catch any cancer development as early as possible. Breast Cancer  Practice breast self-awareness. ? This means understanding how your breasts normally appear and feel. ? It also means doing regular breast self-exams. Let your health care provider know about any changes, no matter how small.  If you are 40  or older, have a clinician do a breast exam (clinical breast exam or CBE) every year. Depending on your age, family history, and medical history, it may be recommended that you also have a yearly breast X-ray (mammogram).  If you  have a family history of breast cancer, talk with your health care provider about genetic screening.  If you are at high risk for breast cancer, talk with your health care provider about having an MRI and a mammogram every year.  Breast cancer (BRCA) gene test is recommended for women who have family members with BRCA-related cancers. Results of the assessment will determine the need for genetic counseling and BRCA1 and for BRCA2 testing. BRCA-related cancers include these types: ? Breast. This occurs in males or females. ? Ovarian. ? Tubal. This may also be called fallopian tube cancer. ? Cancer of the abdominal or pelvic lining (peritoneal cancer). ? Prostate. ? Pancreatic.  Cervical, Uterine, and Ovarian Cancer Your health care provider may recommend that you be screened regularly for cancer of the pelvic organs. These include your ovaries, uterus, and vagina. This screening involves a pelvic exam, which includes checking for microscopic changes to the surface of your cervix (Pap test).  For women ages 21-65, health care providers may recommend a pelvic exam and a Pap test every three years. For women ages 79-65, they may recommend the Pap test and pelvic exam, combined with testing for human papilloma virus (HPV), every five years. Some types of HPV increase your risk of cervical cancer. Testing for HPV may also be done on women of any age who have unclear Pap test results.  Other health care providers may not recommend any screening for nonpregnant women who are considered low risk for pelvic cancer and have no symptoms. Ask your health care provider if a screening pelvic exam is right for you.  If you have had past treatment for cervical cancer or a condition that could lead to cancer, you need Pap tests and screening for cancer for at least 20 years after your treatment. If Pap tests have been discontinued for you, your risk factors (such as having a new sexual partner) need to be  reassessed to determine if you should start having screenings again. Some women have medical problems that increase the chance of getting cervical cancer. In these cases, your health care provider may recommend that you have screening and Pap tests more often.  If you have a family history of uterine cancer or ovarian cancer, talk with your health care provider about genetic screening.  If you have vaginal bleeding after reaching menopause, tell your health care provider.  There are currently no reliable tests available to screen for ovarian cancer.  Lung Cancer Lung cancer screening is recommended for adults 69-62 years old who are at high risk for lung cancer because of a history of smoking. A yearly low-dose CT scan of the lungs is recommended if you:  Currently smoke.  Have a history of at least 30 pack-years of smoking and you currently smoke or have quit within the past 15 years. A pack-year is smoking an average of one pack of cigarettes per day for one year.  Yearly screening should:  Continue until it has been 15 years since you quit.  Stop if you develop a health problem that would prevent you from having lung cancer treatment.  Colorectal Cancer  This type of cancer can be detected and can often be prevented.  Routine colorectal cancer screening usually begins at  age 42 and continues through age 45.  If you have risk factors for colon cancer, your health care provider may recommend that you be screened at an earlier age.  If you have a family history of colorectal cancer, talk with your health care provider about genetic screening.  Your health care provider may also recommend using home test kits to check for hidden blood in your stool.  A small camera at the end of a tube can be used to examine your colon directly (sigmoidoscopy or colonoscopy). This is done to check for the earliest forms of colorectal cancer.  Direct examination of the colon should be repeated every  5-10 years until age 71. However, if early forms of precancerous polyps or small growths are found or if you have a family history or genetic risk for colorectal cancer, you may need to be screened more often.  Skin Cancer  Check your skin from head to toe regularly.  Monitor any moles. Be sure to tell your health care provider: ? About any new moles or changes in moles, especially if there is a change in a mole's shape or color. ? If you have a mole that is larger than the size of a pencil eraser.  If any of your family members has a history of skin cancer, especially at a young age, talk with your health care provider about genetic screening.  Always use sunscreen. Apply sunscreen liberally and repeatedly throughout the day.  Whenever you are outside, protect yourself by wearing long sleeves, pants, a wide-brimmed hat, and sunglasses.  What should I know about osteoporosis? Osteoporosis is a condition in which bone destruction happens more quickly than new bone creation. After menopause, you may be at an increased risk for osteoporosis. To help prevent osteoporosis or the bone fractures that can happen because of osteoporosis, the following is recommended:  If you are 46-71 years old, get at least 1,000 mg of calcium and at least 600 mg of vitamin D per day.  If you are older than age 55 but younger than age 65, get at least 1,200 mg of calcium and at least 600 mg of vitamin D per day.  If you are older than age 54, get at least 1,200 mg of calcium and at least 800 mg of vitamin D per day.  Smoking and excessive alcohol intake increase the risk of osteoporosis. Eat foods that are rich in calcium and vitamin D, and do weight-bearing exercises several times each week as directed by your health care provider. What should I know about how menopause affects my mental health? Depression may occur at any age, but it is more common as you become older. Common symptoms of depression  include:  Low or sad mood.  Changes in sleep patterns.  Changes in appetite or eating patterns.  Feeling an overall lack of motivation or enjoyment of activities that you previously enjoyed.  Frequent crying spells.  Talk with your health care provider if you think that you are experiencing depression. What should I know about immunizations? It is important that you get and maintain your immunizations. These include:  Tetanus, diphtheria, and pertussis (Tdap) booster vaccine.  Influenza every year before the flu season begins.  Pneumonia vaccine.  Shingles vaccine.  Your health care provider may also recommend other immunizations. This information is not intended to replace advice given to you by your health care provider. Make sure you discuss any questions you have with your health care provider. Document Released: 03/22/2005  Document Revised: 08/18/2015 Document Reviewed: 11/01/2014 Elsevier Interactive Patient Education  2018 [employer]  

## 2016-09-25 LAB — URINALYSIS W MICROSCOPIC + REFLEX CULTURE
Bilirubin Urine: NEGATIVE
GLUCOSE, UA: NEGATIVE
HGB URINE DIPSTICK: NEGATIVE
Ketones, ur: NEGATIVE
Leukocytes, UA: NEGATIVE
Nitrite: NEGATIVE
Specific Gravity, Urine: 1.015 (ref 1.001–1.035)
YEAST: NONE SEEN [HPF]
pH: 5.5 (ref 5.0–8.0)

## 2016-09-26 LAB — URINE CULTURE

## 2016-09-27 LAB — PAP, TP IMAGING W/ HPV RNA, RFLX HPV TYPE 16,18/45: HPV MRNA, HIGH RISK: NOT DETECTED

## 2016-11-01 ENCOUNTER — Telehealth: Payer: Self-pay | Admitting: *Deleted

## 2016-11-01 NOTE — Telephone Encounter (Signed)
(  pt aware you are out of the office) Pt called states at OV on 09/24/16 you spoke about a cream she could use vaginally to help with vaginal dryness during intercourse? Please advise

## 2016-11-01 NOTE — Telephone Encounter (Signed)
Could try OTC astroglide or KY jelly, if no effective could try a little estrogen cream which will need a RX. If she  wants the estrogen cream  Estradiol cm generic ok. Apply 2-3 times weekly vaginally. With refills, review minimal systemic absorption.

## 2016-11-04 NOTE — Telephone Encounter (Signed)
Pt infomred, will try OTC first and call back if no relief for estrogen cream.

## 2017-04-11 ENCOUNTER — Encounter (HOSPITAL_COMMUNITY): Payer: Self-pay

## 2017-04-11 ENCOUNTER — Inpatient Hospital Stay (HOSPITAL_COMMUNITY)
Admission: EM | Admit: 2017-04-11 | Discharge: 2017-04-13 | DRG: 340 | Disposition: A | Discharge status: Home / Self Care | Payer: BC Managed Care – PPO | Attending: Surgery | Admitting: Surgery

## 2017-04-11 ENCOUNTER — Emergency Department (HOSPITAL_COMMUNITY): Payer: BC Managed Care – PPO

## 2017-04-11 ENCOUNTER — Other Ambulatory Visit: Payer: Self-pay

## 2017-04-11 DIAGNOSIS — K3533 Acute appendicitis with perforation and localized peritonitis, with abscess: Principal | ICD-10-CM | POA: Diagnosis present

## 2017-04-11 DIAGNOSIS — K3532 Acute appendicitis with perforation and localized peritonitis, without abscess: Secondary | ICD-10-CM | POA: Insufficient documentation

## 2017-04-11 DIAGNOSIS — Z79899 Other long term (current) drug therapy: Secondary | ICD-10-CM

## 2017-04-11 DIAGNOSIS — F17210 Nicotine dependence, cigarettes, uncomplicated: Secondary | ICD-10-CM | POA: Diagnosis present

## 2017-04-11 DIAGNOSIS — R12 Heartburn: Secondary | ICD-10-CM | POA: Diagnosis not present

## 2017-04-11 DIAGNOSIS — I10 Essential (primary) hypertension: Secondary | ICD-10-CM | POA: Diagnosis present

## 2017-04-11 DIAGNOSIS — K3580 Unspecified acute appendicitis: Secondary | ICD-10-CM

## 2017-04-11 DIAGNOSIS — Z885 Allergy status to narcotic agent status: Secondary | ICD-10-CM

## 2017-04-11 DIAGNOSIS — F329 Major depressive disorder, single episode, unspecified: Secondary | ICD-10-CM | POA: Diagnosis present

## 2017-04-11 DIAGNOSIS — F419 Anxiety disorder, unspecified: Secondary | ICD-10-CM | POA: Diagnosis present

## 2017-04-11 DIAGNOSIS — Z720 Tobacco use: Secondary | ICD-10-CM

## 2017-04-11 DIAGNOSIS — F32A Depression, unspecified: Secondary | ICD-10-CM | POA: Diagnosis present

## 2017-04-11 DIAGNOSIS — R1031 Right lower quadrant pain: Secondary | ICD-10-CM | POA: Diagnosis not present

## 2017-04-11 HISTORY — PX: ANKLE SURGERY: SHX546

## 2017-04-11 LAB — COMPREHENSIVE METABOLIC PANEL
ALT: 16 U/L (ref 14–54)
AST: 19 U/L (ref 15–41)
Albumin: 4.4 g/dL (ref 3.5–5.0)
Alkaline Phosphatase: 66 U/L (ref 38–126)
Anion gap: 12 (ref 5–15)
BUN: 13 mg/dL (ref 6–20)
CHLORIDE: 99 mmol/L — AB (ref 101–111)
CO2: 26 mmol/L (ref 22–32)
Calcium: 9.6 mg/dL (ref 8.9–10.3)
Creatinine, Ser: 0.98 mg/dL (ref 0.44–1.00)
GFR calc non Af Amer: >60 mL/min (ref >60)
Glucose, Bld: 113 mg/dL — ABNORMAL HIGH (ref 65–99)
Potassium: 3.6 mmol/L (ref 3.5–5.1)
SODIUM: 137 mmol/L (ref 135–145)
Total Bilirubin: 0.6 mg/dL (ref 0.3–1.2)
Total Protein: 7.6 g/dL (ref 6.5–8.1)

## 2017-04-11 LAB — URINALYSIS, ROUTINE W REFLEX MICROSCOPIC
BILIRUBIN URINE: NEGATIVE
Glucose, UA: NEGATIVE mg/dL
Ketones, ur: NEGATIVE mg/dL
Leukocytes, UA: NEGATIVE
Nitrite: NEGATIVE
PH: 7 (ref 5.0–8.0)
Protein, ur: NEGATIVE mg/dL
SPECIFIC GRAVITY, URINE: 1.011 (ref 1.005–1.030)

## 2017-04-11 LAB — CBC
HCT: 43.2 % (ref 36.0–46.0)
Hemoglobin: 15 g/dL (ref 12.0–15.0)
MCH: 35.4 pg — AB (ref 26.0–34.0)
MCHC: 34.7 g/dL (ref 30.0–36.0)
MCV: 101.9 fL — ABNORMAL HIGH (ref 78.0–100.0)
PLATELETS: 296 10*3/uL (ref 150–400)
RBC: 4.24 MIL/uL (ref 3.87–5.11)
RDW: 12.2 % (ref 11.5–15.5)
WBC: 12.1 10*3/uL — AB (ref 4.0–10.5)

## 2017-04-11 LAB — I-STAT BETA HCG BLOOD, ED (MC, WL, AP ONLY): I-stat hCG, quantitative: 7.2 m[IU]/mL — ABNORMAL HIGH (ref <5)

## 2017-04-11 LAB — LIPASE, BLOOD: LIPASE: 27 U/L (ref 11–51)

## 2017-04-11 MED ORDER — SODIUM CHLORIDE 0.9 % IJ SOLN
INTRAMUSCULAR | Status: AC
  Filled 2017-04-11: qty 50

## 2017-04-11 MED ORDER — FENTANYL CITRATE (PF) 100 MCG/2ML IJ SOLN
50.0000 ug | Freq: Once | INTRAMUSCULAR | Status: AC
  Administered 2017-04-11: 50 ug via INTRAVENOUS
  Filled 2017-04-11: qty 2

## 2017-04-11 MED ORDER — IOPAMIDOL (ISOVUE-300) INJECTION 61%
100.0000 mL | Freq: Once | INTRAVENOUS | Status: AC | PRN
  Administered 2017-04-11: 100 mL via INTRAVENOUS

## 2017-04-11 MED ORDER — SODIUM CHLORIDE 0.9 % IV SOLN
INTRAVENOUS | Status: DC
  Administered 2017-04-12 (×2): via INTRAVENOUS

## 2017-04-11 MED ORDER — IOPAMIDOL (ISOVUE-300) INJECTION 61%
INTRAVENOUS | Status: AC
  Filled 2017-04-11: qty 100

## 2017-04-11 MED ORDER — SODIUM CHLORIDE 0.9 % IV BOLUS (SEPSIS)
1000.0000 mL | Freq: Once | INTRAVENOUS | Status: AC
  Administered 2017-04-11: 1000 mL via INTRAVENOUS

## 2017-04-11 NOTE — ED Triage Notes (Addendum)
Pt reports lower abdominal pain that started this morning, but around 6p it worsened. Urgent Care sent her to rule out appendicitis. Denies N/V. A&Ox4.

## 2017-04-11 NOTE — ED Provider Notes (Signed)
[facility]-EMERGENCY DEPT Provider Note   CSN: [id number] Arrival date & time: 04/11/17  2019     History   Chief Complaint Chief Complaint  Patient presents with  . Abdominal Pain    HPI Adrienne [name] is a 57 y.o. female.  HPI Patient states that she had uneasy feeling in her abdomen since waking this morning.  She then had acute onset sharp stabbing right lower quadrant pain around 6 PM this evening.  No nausea or vomiting.  Did endorse mild diffuse myalgias.  Had one loose bowel movement with no blood in the stool.  Denies any vaginal symptoms including bleeding or discharge.  Denies dysuria, frequency, hematuria or urgency.  Denies flank pain.  Was seen in urgent care and instructed to come to the emergency department to rule out appendicitis. Past Medical History:  Diagnosis Date  . Anxiety    EFFEXOR-DR. FRED WILSON  . Cervical dysplasia   . Elevated cholesterol   . Endometriosis   . Migraine with aura 12/2006   COULD NOT SPEAK, pt has aura without migraines  . Ovarian cyst     Patient Active Problem List   Diagnosis Date Noted  . Essential hypertension, benign 07/15/2012  . Cervical dysplasia   . Ovarian cyst   . Endometriosis   . Migraine with aura   . Anxiety     Past Surgical History:  Procedure Laterality Date  . ABDOMINAL SURGERY  2002   EXPLORATORY LAPAROTOMY, LSO , RIGHT OV CYSTECTOMY  . BRONCHIAL CLEFT CYST    . COLPOSCOPY    . GYNECOLOGIC CRYOSURGERY    . OOPHORECTOMY  2002   LSO, R OVARIAN CYSTECTOMY, EXPLOR LAPAROT  . PELVIC LAPAROSCOPY    . TYMPANOSTOMY TUBE PLACEMENT      OB History    Gravida Para Term Preterm AB Living   1 0     1 0   SAB TAB Ectopic Multiple Live Births                   Home Medications    Prior to Admission medications   Medication Sig Start Date End Date Taking? Authorizing Provider  ALPRAZolam (XANAX) 0.25 MG tablet Take 0.25-0.5 mg by mouth 3 (three) times daily as needed for  anxiety. For anxiety.   Yes [provider]  carvedilol (COREG) 12.5 MG tablet Take 12.5 mg by mouth 2 (two) times daily.  09/19/16  Yes [provider]  hydrochlorothiazide (MICROZIDE) 12.5 MG capsule Take 12.5 mg by mouth daily.   Yes [provider]  ibuprofen (ADVIL,MOTRIN) 200 MG tablet Take 400 mg by mouth every 8 (eight) hours as needed for headache, mild pain or moderate pain. For pain.    Yes [provider]  Multiple Vitamin (MULTIVITAMIN WITH MINERALS) TABS Take 1 tablet by mouth daily.   Yes [provider]  venlafaxine XR (EFFEXOR-XR) 75 MG 24 hr capsule Take 150 mg by mouth daily.    Yes [provider]  hydrocortisone-pramoxine [facility]) 2.5-1 % rectal cream Place rectally 3 (three) times daily. Patient not taking: Reported on 04/11/2017 07/15/12   Harrington Challenger, NP    Family History Family History  Adopted: Yes    Social History Social History   Tobacco Use  . Smoking status: Current Every Day Smoker    Packs/day: 0.50    Types: Cigarettes  . Smokeless tobacco: Never Used  Substance Use Topics  . Alcohol use: Yes    Alcohol/week:  2.5 oz    Types: 5 Standard drinks or equivalent per week  . Drug use: No     Allergies   Morphine and related   Review of Systems Review of Systems  Constitutional: Negative for chills and fever.  HENT: Negative for congestion and sore throat.   Eyes: Negative for visual disturbance.  Respiratory: Negative for cough and shortness of breath.   Cardiovascular: Negative for chest pain, palpitations and leg swelling.  Gastrointestinal: Positive for abdominal pain. Negative for blood in stool, constipation, diarrhea, nausea and vomiting.  Genitourinary: Negative for dysuria, flank pain, frequency, hematuria, vaginal bleeding and vaginal discharge.  Musculoskeletal: Negative for back pain, myalgias, neck pain and neck stiffness.  Skin: Negative for rash and wound.  Neurological:  Negative for dizziness, weakness, light-headedness, numbness and headaches.  All other systems reviewed and are negative.    Physical Exam Updated Vital Signs BP 117/78   Pulse (!) 101   Temp 98.3 F (36.8 C) (Oral)   Resp 18   LMP 04/11/2012   SpO2 96%   Physical Exam  Constitutional: She is oriented to person, place, and time. She appears well-developed and well-nourished. No distress.  HENT:  Head: Normocephalic and atraumatic.  Mouth/Throat: Oropharynx is clear and moist. No oropharyngeal exudate.  Eyes: EOM are normal. Pupils are equal, round, and reactive to light.  Neck: Normal range of motion. Neck supple.  Cardiovascular: Normal rate and regular rhythm. Exam reveals no gallop and no friction rub.  No murmur heard. Pulmonary/Chest: Effort normal and breath sounds normal. No stridor. No respiratory distress. She has no wheezes. She has no rales. She exhibits no tenderness.  Abdominal: Soft. Bowel sounds are normal. She exhibits distension. There is tenderness. There is rebound and guarding.  Mild diffuse abdominal tenderness to palpation appears most pronounced in the left and right lower quadrants.  Rebound and guarding present especially in the right lower quadrant.  Musculoskeletal: Normal range of motion. She exhibits no edema or tenderness.  No CVA tenderness bilaterally  Neurological: She is alert and oriented to person, place, and time.  Moves all extremities without focal deficit.  Sensation fully intact.  Skin: Skin is warm and dry. No rash noted. She is not diaphoretic. No erythema.  Psychiatric: She has a normal mood and affect. Her behavior is normal.  Nursing note and vitals reviewed.    ED Treatments / Results  Labs (all labs ordered are listed, but only abnormal results are displayed) Labs Reviewed  COMPREHENSIVE METABOLIC PANEL - Abnormal; Notable for the following components:      Result Value   Chloride 99 (*)    Glucose, Bld 113 (*)    All other  components within normal limits  CBC - Abnormal; Notable for the following components:   WBC 12.1 (*)    MCV 101.9 (*)    MCH 35.4 (*)    All other components within normal limits  URINALYSIS, ROUTINE W REFLEX MICROSCOPIC - Abnormal; Notable for the following components:   Hgb urine dipstick SMALL (*)    Bacteria, UA RARE (*)    Squamous Epithelial / LPF 0-5 (*)    All other components within normal limits  I-STAT BETA HCG BLOOD, ED ([facility], [facility], [facility] ONLY) - Abnormal; Notable for the following components:   I-stat hCG, quantitative 7.2 (*)    All other components within normal limits  LIPASE, BLOOD    EKG  EKG Interpretation None       Radiology Ct Abdomen Pelvis  W Contrast  Result Date: 04/11/2017 CLINICAL DATA:  Lower abdominal pain EXAM: CT ABDOMEN AND PELVIS WITH CONTRAST TECHNIQUE: Multidetector CT imaging of the abdomen and pelvis was performed using the standard protocol following bolus administration of intravenous contrast. CONTRAST:  100mL ISOVUE-300 IOPAMIDOL (ISOVUE-300) INJECTION 61%, <See Chart> ISOVUE-300 IOPAMIDOL (ISOVUE-300) INJECTION 61% COMPARISON:  None. FINDINGS: Lower chest: Lung bases demonstrate no acute consolidation or effusion. Normal heart size. Hepatobiliary: No focal liver abnormality is seen. No gallstones, gallbladder wall thickening, or biliary dilatation. Pancreas: Unremarkable. No pancreatic ductal dilatation or surrounding inflammatory changes. Spleen: Normal in size without focal abnormality. Adrenals/Urinary Tract: Right adrenal gland is normal. 2.3 cm indeterminate left adrenal mass. Exophytic 12 mm hypodensity upper pole left kidney, slight increased density values. No hydronephrosis. The bladder is normal Stomach/Bowel: The stomach is nonenlarged. Thickened appearance of right lower quadrant distal ileal loops with mucosal enhancement. Appendix slightly enlarged, measuring up to 9 mm in size. Considerable inflammation around the appendix with the tip  terminating in an inflammatory soft tissue density that is contiguous with the right adnexa, series 2, image number 61. No extraluminal gas. No stone. Vascular/Lymphatic: Moderate aortic atherosclerosis. No aneurysmal dilatation. No significantly enlarged lymph nodes. Reproductive: Uterus unremarkable. Inflammatory changes in the right adnexa with 19 mm inflammatory right adnexal mass. Other: Negative for free air or free fluid. Musculoskeletal: Trace anterolisthesis of L4 on L5. No acute or suspicious lesion. IMPRESSION: 1. Right lower quadrant inflammatory process. Appendix is slightly enlarged up to 9 mm in size with enhancement present. The tip of the appendix appears contiguous with a 19 mm inflammatory mass centered in the right adnexa, adjacent to the right ovary. Differential considerations include appendicitis with tip perforation and adjacent phlegmon. Given close proximity to the adnexa/right ovary, could also consider PID with secondary inflammation of the appendix or other cause of inflammatory adnexal mass such as endometriosis. No free air. Appendix: Location: Right lower quadrant Diameter: 9 mm Appendicolith: Not seen Mucosal hyper-enhancement: Present Extraluminal gas: Not seen Periappendiceal collection: No significant rim enhancing abscess. Possible phlegmon adjacent to the tip of the appendix. 1. Thickened right lower quadrant distal ileal loops are likely reactive 2. Indeterminate 2.3 cm left adrenal mass. Recommend further evaluation with nonemergent adrenal CT. 3. 12 mm intermediate density arising from the upper pole of left kidney. Could be further evaluated with multiphasic CT or MRI, nonemergently. Electronically Signed   By: Jasmine PangKim  Fujinaga M.D.   On: 04/11/2017 23:23    Procedures Procedures (including critical care time)  Medications Ordered in ED Medications  iopamidol (ISOVUE-300) 61 % injection (  Canceled Entry 04/11/17 2245)  sodium chloride 0.9 % injection (not administered)    0.9 %  sodium chloride infusion (not administered)  fentaNYL (SUBLIMAZE) injection 50 mcg (50 mcg Intravenous Given 04/11/17 2131)  sodium chloride 0.9 % bolus 1,000 mL (0 mLs Intravenous Stopped 04/11/17 2219)  iopamidol (ISOVUE-300) 61 % injection 100 mL (100 mLs Intravenous Contrast Given 04/11/17 2242)     Initial Impression / Assessment and Plan / ED Course  I have reviewed the triage vital signs and the nursing notes.  Pertinent labs & imaging results that were available during my care of the patient were reviewed by me and considered in my medical decision making (see chart for details).    Inflammatory changes are the right lower quadrant.  Patient denies any vaginal symptoms.  Exam consistent with appendicitis.  Will discuss with general surgery.  Dr. Michaell CowingGross to admit patient for likely acute  appendicitis.  Final Clinical Impressions(s) / ED Diagnoses   Final diagnoses:  Acute appendicitis, unspecified acute appendicitis type    ED Discharge Orders    None       Loren Racer, MD 04/12/17 0006

## 2017-04-11 NOTE — ED Notes (Signed)
Bed: WA21 Expected date:  Expected time:  Means of arrival:  Comments[name]

## 2017-04-12 ENCOUNTER — Encounter (HOSPITAL_COMMUNITY): Payer: Self-pay

## 2017-04-12 ENCOUNTER — Inpatient Hospital Stay (HOSPITAL_COMMUNITY): Payer: BC Managed Care – PPO | Admitting: Certified Registered Nurse Anesthetist

## 2017-04-12 ENCOUNTER — Encounter (HOSPITAL_COMMUNITY): Admission: EM | Disposition: A | Discharge status: Home / Self Care | Payer: Self-pay | Source: Home / Self Care

## 2017-04-12 DIAGNOSIS — Z79899 Other long term (current) drug therapy: Secondary | ICD-10-CM | POA: Diagnosis not present

## 2017-04-12 DIAGNOSIS — Z885 Allergy status to narcotic agent status: Secondary | ICD-10-CM | POA: Diagnosis not present

## 2017-04-12 DIAGNOSIS — R12 Heartburn: Secondary | ICD-10-CM | POA: Diagnosis not present

## 2017-04-12 DIAGNOSIS — R1031 Right lower quadrant pain: Secondary | ICD-10-CM | POA: Diagnosis present

## 2017-04-12 DIAGNOSIS — F419 Anxiety disorder, unspecified: Secondary | ICD-10-CM | POA: Diagnosis present

## 2017-04-12 DIAGNOSIS — K3533 Acute appendicitis with perforation and localized peritonitis, with abscess: Secondary | ICD-10-CM | POA: Diagnosis present

## 2017-04-12 DIAGNOSIS — Z720 Tobacco use: Secondary | ICD-10-CM

## 2017-04-12 DIAGNOSIS — I10 Essential (primary) hypertension: Secondary | ICD-10-CM | POA: Diagnosis present

## 2017-04-12 DIAGNOSIS — K3532 Acute appendicitis with perforation and localized peritonitis, without abscess: Secondary | ICD-10-CM | POA: Insufficient documentation

## 2017-04-12 DIAGNOSIS — F329 Major depressive disorder, single episode, unspecified: Secondary | ICD-10-CM | POA: Diagnosis present

## 2017-04-12 DIAGNOSIS — F17210 Nicotine dependence, cigarettes, uncomplicated: Secondary | ICD-10-CM | POA: Diagnosis present

## 2017-04-12 HISTORY — PX: LAPAROSCOPIC APPENDECTOMY: SHX408

## 2017-04-12 LAB — SURGICAL PCR SCREEN
MRSA, PCR: NEGATIVE
STAPHYLOCOCCUS AUREUS: NEGATIVE

## 2017-04-12 LAB — HIV ANTIBODY (ROUTINE TESTING W REFLEX): HIV Screen 4th Generation wRfx: NONREACTIVE

## 2017-04-12 SURGERY — APPENDECTOMY, LAPAROSCOPIC
Anesthesia: General | Site: Abdomen

## 2017-04-12 MED ORDER — LACTATED RINGERS IR SOLN
Status: DC | PRN
  Administered 2017-04-12 (×2): 2000 mL

## 2017-04-12 MED ORDER — PIPERACILLIN-TAZOBACTAM 3.375 G IVPB
3.3750 g | Freq: Three times a day (TID) | INTRAVENOUS | Status: DC
  Administered 2017-04-12: 3.375 g via INTRAVENOUS
  Filled 2017-04-12: qty 50

## 2017-04-12 MED ORDER — OXYCODONE HCL 5 MG PO TABS: 5.0000 mg | ORAL_TABLET | ORAL | Status: DC | PRN

## 2017-04-12 MED ORDER — SUGAMMADEX SODIUM 200 MG/2ML IV SOLN
INTRAVENOUS | Status: DC | PRN
  Administered 2017-04-12: 150 mg via INTRAVENOUS

## 2017-04-12 MED ORDER — PROMETHAZINE HCL 25 MG/ML IJ SOLN: 6.2500 mg | INTRAMUSCULAR | Status: DC | PRN

## 2017-04-12 MED ORDER — LIDOCAINE 2% (20 MG/ML) 5 ML SYRINGE
INTRAMUSCULAR | Status: AC
  Filled 2017-04-12: qty 5

## 2017-04-12 MED ORDER — SODIUM CHLORIDE 0.9 % IV SOLN
2.0000 g | INTRAVENOUS | Status: AC
  Administered 2017-04-12: 2 g via INTRAVENOUS
  Filled 2017-04-12: qty 2

## 2017-04-12 MED ORDER — LACTATED RINGERS IV BOLUS (SEPSIS): 1000.0000 mL | Freq: Three times a day (TID) | INTRAVENOUS | Status: DC | PRN

## 2017-04-12 MED ORDER — ACETAMINOPHEN 650 MG RE SUPP: 650.0000 mg | Freq: Four times a day (QID) | RECTAL | Status: DC | PRN

## 2017-04-12 MED ORDER — ADULT MULTIVITAMIN W/MINERALS CH
1.0000 | ORAL_TABLET | Freq: Every day | ORAL | Status: DC
  Administered 2017-04-12: 1 via ORAL
  Filled 2017-04-12: qty 1

## 2017-04-12 MED ORDER — LACTATED RINGERS IV BOLUS (SEPSIS): 1000.0000 mL | Freq: Once | INTRAVENOUS | Status: AC

## 2017-04-12 MED ORDER — SODIUM CHLORIDE 0.9 % IV SOLN
INTRAVENOUS | Status: AC
  Administered 2017-04-12: 1000 mL via INTRAPERITONEAL
  Filled 2017-04-12: qty 6

## 2017-04-12 MED ORDER — METHOCARBAMOL 500 MG PO TABS: 1000.0000 mg | ORAL_TABLET | Freq: Four times a day (QID) | ORAL | Status: DC | PRN

## 2017-04-12 MED ORDER — CHLORHEXIDINE GLUCONATE CLOTH 2 % EX PADS: 6.0000 | MEDICATED_PAD | Freq: Once | CUTANEOUS | Status: AC

## 2017-04-12 MED ORDER — METOCLOPRAMIDE HCL 5 MG/ML IJ SOLN: 10.0000 mg | Freq: Four times a day (QID) | INTRAMUSCULAR | Status: DC | PRN

## 2017-04-12 MED ORDER — FENTANYL CITRATE (PF) 250 MCG/5ML IJ SOLN
INTRAMUSCULAR | Status: AC
  Filled 2017-04-12: qty 5

## 2017-04-12 MED ORDER — ACETAMINOPHEN 500 MG PO TABS
1000.0000 mg | ORAL_TABLET | ORAL | Status: AC
  Administered 2017-04-12: 1000 mg via ORAL
  Filled 2017-04-12: qty 2

## 2017-04-12 MED ORDER — MENTHOL 3 MG MT LOZG
1.0000 | LOZENGE | OROMUCOSAL | Status: DC | PRN
  Filled 2017-04-12: qty 9

## 2017-04-12 MED ORDER — NICOTINE 14 MG/24HR TD PT24
14.0000 mg | MEDICATED_PATCH | Freq: Every day | TRANSDERMAL | Status: DC
Start: 2017-04-12 — End: 2017-04-13
  Filled 2017-04-12: qty 1

## 2017-04-12 MED ORDER — ONDANSETRON HCL 4 MG/2ML IJ SOLN
4.0000 mg | Freq: Four times a day (QID) | INTRAMUSCULAR | Status: DC | PRN
  Administered 2017-04-12: 4 mg via INTRAVENOUS
  Filled 2017-04-12: qty 2

## 2017-04-12 MED ORDER — EPHEDRINE SULFATE 50 MG/ML IJ SOLN
INTRAMUSCULAR | Status: DC | PRN
  Administered 2017-04-12 (×3): 10 mg via INTRAVENOUS

## 2017-04-12 MED ORDER — ALBUTEROL SULFATE (2.5 MG/3ML) 0.083% IN NEBU: 2.5000 mg | INHALATION_SOLUTION | RESPIRATORY_TRACT | Status: DC | PRN

## 2017-04-12 MED ORDER — PROCHLORPERAZINE EDISYLATE 5 MG/ML IJ SOLN: 5.0000 mg | INTRAMUSCULAR | Status: DC | PRN

## 2017-04-12 MED ORDER — DEXAMETHASONE SODIUM PHOSPHATE 10 MG/ML IJ SOLN
INTRAMUSCULAR | Status: DC | PRN
  Administered 2017-04-12: 10 mg via INTRAVENOUS

## 2017-04-12 MED ORDER — HYDROCORTISONE ACE-PRAMOXINE 2.5-1 % RE CREA
TOPICAL_CREAM | Freq: Three times a day (TID) | RECTAL | Status: DC
  Filled 2017-04-12: qty 30

## 2017-04-12 MED ORDER — METHOCARBAMOL 1000 MG/10ML IJ SOLN
1000.0000 mg | Freq: Four times a day (QID) | INTRAMUSCULAR | Status: DC | PRN
  Filled 2017-04-12: qty 10

## 2017-04-12 MED ORDER — HYDROCORTISONE 1 % EX CREA
1.0000 "application " | TOPICAL_CREAM | Freq: Three times a day (TID) | CUTANEOUS | Status: DC | PRN
  Filled 2017-04-12: qty 28

## 2017-04-12 MED ORDER — BISACODYL 10 MG RE SUPP: 10.0000 mg | Freq: Two times a day (BID) | RECTAL | Status: DC | PRN

## 2017-04-12 MED ORDER — BUPIVACAINE-EPINEPHRINE 0.5% -1:200000 IJ SOLN
INTRAMUSCULAR | Status: DC | PRN
  Administered 2017-04-12: 35 mL

## 2017-04-12 MED ORDER — PROPOFOL 10 MG/ML IV BOLUS
INTRAVENOUS | Status: AC
  Filled 2017-04-12: qty 20

## 2017-04-12 MED ORDER — DIPHENHYDRAMINE HCL 12.5 MG/5ML PO ELIX: 12.5000 mg | ORAL_SOLUTION | Freq: Four times a day (QID) | ORAL | Status: DC | PRN

## 2017-04-12 MED ORDER — ONDANSETRON 4 MG PO TBDP: 4.0000 mg | ORAL_TABLET | Freq: Four times a day (QID) | ORAL | Status: DC | PRN

## 2017-04-12 MED ORDER — DIPHENHYDRAMINE HCL 50 MG/ML IJ SOLN: 12.5000 mg | Freq: Four times a day (QID) | INTRAMUSCULAR | Status: DC | PRN

## 2017-04-12 MED ORDER — BUPIVACAINE-EPINEPHRINE 0.5% -1:200000 IJ SOLN
INTRAMUSCULAR | Status: AC
  Filled 2017-04-12: qty 1

## 2017-04-12 MED ORDER — ONDANSETRON HCL 4 MG/2ML IJ SOLN
INTRAMUSCULAR | Status: AC
Start: 2017-04-12 — End: 2017-04-12
  Filled 2017-04-12: qty 2

## 2017-04-12 MED ORDER — STERILE WATER FOR IRRIGATION IR SOLN
Status: DC | PRN
  Administered 2017-04-12: 1000 mL

## 2017-04-12 MED ORDER — FENTANYL CITRATE (PF) 100 MCG/2ML IJ SOLN
INTRAMUSCULAR | Status: DC | PRN
  Administered 2017-04-12 (×4): 50 ug via INTRAVENOUS

## 2017-04-12 MED ORDER — BUPIVACAINE HCL 0.25 % IJ SOLN
INTRAMUSCULAR | Status: AC
  Filled 2017-04-12: qty 1

## 2017-04-12 MED ORDER — HYDROCORTISONE 2.5 % RE CREA
1.0000 "application " | TOPICAL_CREAM | Freq: Four times a day (QID) | RECTAL | Status: DC | PRN
  Filled 2017-04-12: qty 28.35

## 2017-04-12 MED ORDER — CELECOXIB 200 MG PO CAPS
200.0000 mg | ORAL_CAPSULE | ORAL | Status: AC
  Administered 2017-04-12: 200 mg via ORAL
  Filled 2017-04-12: qty 1

## 2017-04-12 MED ORDER — GABAPENTIN 300 MG PO CAPS
300.0000 mg | ORAL_CAPSULE | ORAL | Status: AC
  Administered 2017-04-12: 300 mg via ORAL
  Filled 2017-04-12: qty 1

## 2017-04-12 MED ORDER — PROPOFOL 10 MG/ML IV BOLUS
INTRAVENOUS | Status: DC | PRN
  Administered 2017-04-12: 150 mg via INTRAVENOUS

## 2017-04-12 MED ORDER — PSYLLIUM 95 % PO PACK
1.0000 | PACK | Freq: Every day | ORAL | Status: DC
  Administered 2017-04-12: 1 via ORAL
  Filled 2017-04-12: qty 1

## 2017-04-12 MED ORDER — GUAIFENESIN-DM 100-10 MG/5ML PO SYRP: 10.0000 mL | ORAL_SOLUTION | ORAL | Status: DC | PRN

## 2017-04-12 MED ORDER — SODIUM CHLORIDE 0.9 % IV SOLN: 250.0000 mL | INTRAVENOUS | Status: DC | PRN

## 2017-04-12 MED ORDER — LACTATED RINGERS IV SOLN
INTRAVENOUS | Status: DC | PRN
  Administered 2017-04-12: 09:00:00 via INTRAVENOUS

## 2017-04-12 MED ORDER — ONDANSETRON HCL 4 MG/2ML IJ SOLN
INTRAMUSCULAR | Status: DC | PRN
  Administered 2017-04-12: 4 mg via INTRAVENOUS

## 2017-04-12 MED ORDER — ACETAMINOPHEN 325 MG PO TABS: 650.0000 mg | ORAL_TABLET | Freq: Four times a day (QID) | ORAL | Status: DC | PRN

## 2017-04-12 MED ORDER — LIDOCAINE 2% (20 MG/ML) 5 ML SYRINGE
INTRAMUSCULAR | Status: DC | PRN
  Administered 2017-04-12: 80 mg via INTRAVENOUS

## 2017-04-12 MED ORDER — METRONIDAZOLE IN NACL 5-0.79 MG/ML-% IV SOLN
500.0000 mg | INTRAVENOUS | Status: AC
  Administered 2017-04-12: 500 mg via INTRAVENOUS
  Filled 2017-04-12: qty 100

## 2017-04-12 MED ORDER — CARVEDILOL 12.5 MG PO TABS
12.5000 mg | ORAL_TABLET | Freq: Two times a day (BID) | ORAL | Status: DC
  Administered 2017-04-12 – 2017-04-13 (×2): 12.5 mg via ORAL
  Filled 2017-04-12 (×3): qty 1

## 2017-04-12 MED ORDER — SODIUM CHLORIDE 0.9 % IV SOLN
INTRAVENOUS | Status: DC | PRN
  Administered 2017-04-12: 07:00:00 via INTRAVENOUS

## 2017-04-12 MED ORDER — SODIUM CHLORIDE 0.9% FLUSH: 3.0000 mL | Freq: Two times a day (BID) | INTRAVENOUS | Status: DC

## 2017-04-12 MED ORDER — ALPRAZOLAM 0.25 MG PO TABS: 0.2500 mg | ORAL_TABLET | Freq: Three times a day (TID) | ORAL | Status: DC | PRN

## 2017-04-12 MED ORDER — LIP MEDEX EX OINT
1.0000 "application " | TOPICAL_OINTMENT | Freq: Two times a day (BID) | CUTANEOUS | Status: DC
  Administered 2017-04-12 – 2017-04-13 (×3): 1 via TOPICAL
  Filled 2017-04-12 (×2): qty 7

## 2017-04-12 MED ORDER — MAGIC MOUTHWASH
15.0000 mL | Freq: Four times a day (QID) | ORAL | Status: DC | PRN
Start: 2017-04-12 — End: 2017-04-13
  Filled 2017-04-12: qty 15

## 2017-04-12 MED ORDER — ALUM & MAG HYDROXIDE-SIMETH 200-200-20 MG/5ML PO SUSP: 30.0000 mL | Freq: Four times a day (QID) | ORAL | Status: DC | PRN

## 2017-04-12 MED ORDER — OXYCODONE HCL 5 MG PO TABS: 5.0000 mg | ORAL_TABLET | Freq: Four times a day (QID) | ORAL | 0 refills | Status: DC | PRN

## 2017-04-12 MED ORDER — CHLORHEXIDINE GLUCONATE CLOTH 2 % EX PADS
6.0000 | MEDICATED_PAD | Freq: Once | CUTANEOUS | Status: AC
  Administered 2017-04-12: 6 via TOPICAL

## 2017-04-12 MED ORDER — PHENOL 1.4 % MT LIQD
1.0000 | OROMUCOSAL | Status: DC | PRN
  Filled 2017-04-12: qty 177

## 2017-04-12 MED ORDER — VENLAFAXINE HCL ER 150 MG PO CP24
150.0000 mg | ORAL_CAPSULE | Freq: Every day | ORAL | Status: DC
  Administered 2017-04-12: 150 mg via ORAL
  Filled 2017-04-12: qty 1

## 2017-04-12 MED ORDER — PIPERACILLIN-TAZOBACTAM 3.375 G IVPB
3.3750 g | Freq: Three times a day (TID) | INTRAVENOUS | Status: DC
  Administered 2017-04-12 – 2017-04-13 (×3): 3.375 g via INTRAVENOUS
  Filled 2017-04-12 (×3): qty 50

## 2017-04-12 MED ORDER — HYDROMORPHONE HCL 1 MG/ML IJ SOLN
0.5000 mg | INTRAMUSCULAR | Status: DC | PRN
  Administered 2017-04-12: 1 mg via INTRAVENOUS
  Filled 2017-04-12: qty 1

## 2017-04-12 MED ORDER — ROCURONIUM BROMIDE 10 MG/ML (PF) SYRINGE
PREFILLED_SYRINGE | INTRAVENOUS | Status: AC
  Filled 2017-04-12: qty 5

## 2017-04-12 MED ORDER — SIMETHICONE 80 MG PO CHEW
40.0000 mg | CHEWABLE_TABLET | Freq: Four times a day (QID) | ORAL | Status: DC | PRN
  Filled 2017-04-12: qty 1

## 2017-04-12 MED ORDER — 0.9 % SODIUM CHLORIDE (POUR BTL) OPTIME
TOPICAL | Status: DC | PRN
  Administered 2017-04-12: 1000 mL

## 2017-04-12 MED ORDER — AMOXICILLIN-POT CLAVULANATE 875-125 MG PO TABS: 1.0000 | ORAL_TABLET | Freq: Two times a day (BID) | ORAL | 1 refills | Status: DC

## 2017-04-12 MED ORDER — ROCURONIUM BROMIDE 50 MG/5ML IV SOSY
PREFILLED_SYRINGE | INTRAVENOUS | Status: DC | PRN
  Administered 2017-04-12: 10 mg via INTRAVENOUS
  Administered 2017-04-12: 30 mg via INTRAVENOUS

## 2017-04-12 MED ORDER — IBUPROFEN 200 MG PO TABS
400.0000 mg | ORAL_TABLET | Freq: Three times a day (TID) | ORAL | Status: DC | PRN
  Administered 2017-04-13: 400 mg via ORAL
  Filled 2017-04-12: qty 2

## 2017-04-12 MED ORDER — SODIUM CHLORIDE 0.9% FLUSH: 3.0000 mL | INTRAVENOUS | Status: DC | PRN

## 2017-04-12 MED ORDER — HYDROMORPHONE HCL 1 MG/ML IJ SOLN: 0.2500 mg | INTRAMUSCULAR | Status: DC | PRN

## 2017-04-12 MED ORDER — DEXAMETHASONE SODIUM PHOSPHATE 10 MG/ML IJ SOLN
INTRAMUSCULAR | Status: AC
Start: 2017-04-12 — End: 2017-04-12
  Filled 2017-04-12: qty 1

## 2017-04-12 MED ORDER — MIDAZOLAM HCL 2 MG/2ML IJ SOLN
INTRAMUSCULAR | Status: AC
  Filled 2017-04-12: qty 2

## 2017-04-12 MED ORDER — LACTATED RINGERS IV SOLN: INTRAVENOUS | Status: DC

## 2017-04-12 MED ORDER — LACTATED RINGERS IV SOLN: INTRAVENOUS | Status: DC | PRN

## 2017-04-12 MED ORDER — ACETAMINOPHEN 500 MG PO TABS
1000.0000 mg | ORAL_TABLET | Freq: Three times a day (TID) | ORAL | Status: DC
  Administered 2017-04-12 (×2): 1000 mg via ORAL
  Filled 2017-04-12 (×2): qty 2

## 2017-04-12 MED ORDER — GABAPENTIN 300 MG PO CAPS
300.0000 mg | ORAL_CAPSULE | Freq: Two times a day (BID) | ORAL | Status: DC
Start: 2017-04-12 — End: 2017-04-13
  Administered 2017-04-12 (×2): 300 mg via ORAL
  Filled 2017-04-12 (×2): qty 1

## 2017-04-12 MED ORDER — ENOXAPARIN SODIUM 40 MG/0.4ML ~~LOC~~ SOLN
40.0000 mg | Freq: Every day | SUBCUTANEOUS | Status: DC
  Filled 2017-04-12: qty 0.4

## 2017-04-12 SURGICAL SUPPLY — 46 items
APPLIER CLIP 5 13 M/L LIGAMAX5 (MISCELLANEOUS)
APPLIER CLIP ROT 10 11.4 M/L (STAPLE)
CABLE HIGH FREQUENCY MONO STRZ (ELECTRODE) ×3 IMPLANT
CHLORAPREP W/TINT 26ML (MISCELLANEOUS) ×3 IMPLANT
CLIP APPLIE 5 13 M/L LIGAMAX5 (MISCELLANEOUS) IMPLANT
CLIP APPLIE ROT 10 11.4 M/L (STAPLE) IMPLANT
COVER SURGICAL LIGHT HANDLE (MISCELLANEOUS) ×3 IMPLANT
CUTTER FLEX LINEAR 45M (STAPLE) IMPLANT
DECANTER SPIKE VIAL GLASS SM (MISCELLANEOUS) ×3 IMPLANT
DEVICE TROCAR PUNCTURE CLOSURE (ENDOMECHANICALS) IMPLANT
DRAPE LAPAROSCOPIC ABDOMINAL (DRAPES) ×3 IMPLANT
DRAPE WARM FLUID 44X44 (DRAPE) ×3 IMPLANT
DRSG TEGADERM 2-3/8X2-3/4 SM (GAUZE/BANDAGES/DRESSINGS) ×6 IMPLANT
DRSG TEGADERM 4X4.75 (GAUZE/BANDAGES/DRESSINGS) ×3 IMPLANT
ELECT REM PT RETURN 15FT ADLT (MISCELLANEOUS) ×3 IMPLANT
ENDOLOOP SUT PDS II  0 18 (SUTURE)
ENDOLOOP SUT PDS II 0 18 (SUTURE) IMPLANT
GAUZE SPONGE 2X2 8PLY STRL LF (GAUZE/BANDAGES/DRESSINGS) ×1 IMPLANT
GLOVE ECLIPSE 8.0 STRL XLNG CF (GLOVE) ×3 IMPLANT
GLOVE INDICATOR 8.0 STRL GRN (GLOVE) ×3 IMPLANT
GOWN STRL REUS W/TWL XL LVL3 (GOWN DISPOSABLE) ×6 IMPLANT
IRRIG SUCT STRYKERFLOW 2 WTIP (MISCELLANEOUS) ×3
IRRIGATION SUCT STRKRFLW 2 WTP (MISCELLANEOUS) ×1 IMPLANT
KIT BASIN OR (CUSTOM PROCEDURE TRAY) ×3 IMPLANT
PAD POSITIONING PINK XL (MISCELLANEOUS) ×3 IMPLANT
POUCH SPECIMEN RETRIEVAL 10MM (ENDOMECHANICALS) IMPLANT
RELOAD 45 VASCULAR/THIN (ENDOMECHANICALS) IMPLANT
RELOAD STAPLE TA45 3.5 REG BLU (ENDOMECHANICALS) IMPLANT
RELOAD STAPLER BLUE 60MM (STAPLE) ×1 IMPLANT
SCISSORS LAP 5X35 DISP (ENDOMECHANICALS) ×3 IMPLANT
SHEARS HARMONIC ACE PLUS 36CM (ENDOMECHANICALS) ×3 IMPLANT
SLEEVE XCEL OPT CAN 5 100 (ENDOMECHANICALS) ×3 IMPLANT
SPONGE GAUZE 2X2 STER 10/PKG (GAUZE/BANDAGES/DRESSINGS) ×2
STAPLE ECHEON FLEX 60 POW ENDO (STAPLE) ×3 IMPLANT
STAPLER RELOAD BLUE 60MM (STAPLE) ×3
SUT MNCRL AB 4-0 PS2 18 (SUTURE) ×3 IMPLANT
SUT PDS AB 0 CT1 36 (SUTURE) IMPLANT
SUT PDS AB 1 CT1 27 (SUTURE) ×3 IMPLANT
SUT SILK 2 0 SH (SUTURE) IMPLANT
TOWEL OR 17X26 10 PK STRL BLUE (TOWEL DISPOSABLE) ×3 IMPLANT
TRAY FOLEY W/METER SILVER 14FR (SET/KITS/TRAYS/PACK) ×3 IMPLANT
TRAY FOLEY W/METER SILVER 16FR (SET/KITS/TRAYS/PACK) IMPLANT
TRAY LAPAROSCOPIC (CUSTOM PROCEDURE TRAY) ×3 IMPLANT
TROCAR BLADELESS OPT 5 100 (ENDOMECHANICALS) ×3 IMPLANT
TROCAR XCEL 12X100 BLDLESS (ENDOMECHANICALS) ×3 IMPLANT
TUBING INSUF HEATED (TUBING) ×3 IMPLANT

## 2017-04-12 NOTE — H&P (Addendum)
[facility]  [address] [place], [place] [address] Phone: [phone] FAX: Dorris  1960/08/25 [id number]  CARE TEAM:  PCP: Christain Sacramento, MD  Outpatient Care Team: Patient Care Team: Christain Sacramento, MD as PCP - General (Family Medicine)  Inpatient Treatment Team: Treatment Team: Attending Provider: Nolon Nations, MD; Registered Nurse: Erick Colace, RN; Respiratory Therapist: Nelly Laurence, RRT; Technician: Efraim Kaufmann, NT   This patient is a 57 y.o.female who presents today for surgical evaluation at the request of Dr Lita Mains.   Chief complaint / Reason for evaluation: Lower abdominal pain with probable appendicitis  Smoking active female who awoke with abdominal discomfort.  Initially vague but became more intense in the lower abdomen, especially the right lower side.  Decreased appetite.  Some nausea.  No dysuria nor pyuria.  No history of kidney stones.  No sick contacts or diarrhea.  Became much more intense.  Based on concerns, came with her husband.  Exam and CT scan suspicious for abscess between appendix and right ovary.  Surgical consultation requested to rule out appendicitis.    Patient had diagnostic laparoscopy for endometriosis.  No recent issues.  No personal nor family history of GI/colon cancer, inflammatory bowel disease, irritable bowel syndrome, allergy such as Celiac Sprue, dietary/dairy problems, colitis, ulcers nor gastritis.  No recent sick contacts/gastroenteritis.  No travel outside the country.  No changes in diet.  No dysphagia to solids or liquids.  No significant heartburn or reflux.  No hematochezia, hematemesis, coffee ground emesis.  No evidence of prior gastric/peptic ulceration.  No exertional chest/neck/shoulder/arm pain.  Patient can walk 180 minutes for about 5 miles without difficulty.    Assessment  Adrienne [name]  57 y.o. female      Problem  List:  Principal Problem:   Acute appendicitis with peritoneal abscess Active Problems:   Anxiety   Essential hypertension   Tobacco abuse   Depression   History and physical suspicious for appendicitis.  Abscess at tip of the appendix.  To my view seems more suggestive of appendicitis with small abscess.  A little surprising with less than 24-hour symptoms.  Plan:  Admit.  IV antibiotics.  IV fluids.  Diagnostic laparoscopy with probable appendectomy this admission.  STOP SMOKING! We talked to the patient about the dangers of smoking.  We stressed that tobacco use dramatically increases the risk of peri-operative complications such as infection, tissue necrosis leaving to problems with incision/wound and organ healing, hernia, chronic pain, heart attack, stroke, DVT, pulmonary embolism, and death.  We noted there are programs in our community to help stop smoking.  Information was available.  Depression.  Control was affects R.  Heartburn/reflux.  As needed Maalox for now.  Hypertension.  Continue Coreg.  As needed backup.  Hold diuretic.  Anxiolysis.  PRN Xanax home dose -VTE prophylaxis- SCDs, etc -mobilize as tolerated to help recovery  40 minutes spent in review, evaluation, examination, counseling, and coordination of care.  More than 50% of that time was spent in counseling.  Adin Hector, M.D., F.A.C.S. Gastrointestinal and Minimally Invasive Surgery [facility] [address] [place], [place] [address] [phone] Main / Paging   04/12/2017      Past Medical History:  Diagnosis Date  . Anxiety    EFFEXOR-DR. FRED WILSON  . Cervical dysplasia   . Elevated cholesterol   . Endometriosis   .  Migraine with aura 12/2006   COULD NOT SPEAK, pt has aura without migraines  . Ovarian cyst     Past Surgical History:  Procedure Laterality Date  . ABDOMINAL SURGERY  2002   EXPLORATORY LAPAROTOMY, LSO , RIGHT OV  CYSTECTOMY  . BRONCHIAL CLEFT CYST    . COLPOSCOPY    . GYNECOLOGIC CRYOSURGERY    . OOPHORECTOMY  2002   LSO, R OVARIAN CYSTECTOMY, EXPLOR LAPAROT  . PELVIC LAPAROSCOPY    . TYMPANOSTOMY TUBE PLACEMENT      Social History   Socioeconomic History  . Marital status: Married    Spouse name: Not on file  . Number of children: Not on file  . Years of education: Not on file  . Highest education level: Not on file  Social Needs  . Financial resource strain: Not on file  . Food insecurity - worry: Not on file  . Food insecurity - inability: Not on file  . Transportation needs - medical: Not on file  . Transportation needs - non-medical: Not on file  Occupational History  . Not on file  Tobacco Use  . Smoking status: Current Every Day Smoker    Packs/day: 0.50    Types: Cigarettes  . Smokeless tobacco: Never Used  Substance and Sexual Activity  . Alcohol use: Yes    Alcohol/week: 2.5 oz    Types: 5 Standard drinks or equivalent per week  . Drug use: No  . Sexual activity: Yes    Birth control/protection: None, Post-menopausal    Comment: neg des, intercourse age 33, more than 5 sexual partners  Other Topics Concern  . Not on file  Social History Narrative  . Not on file    Family History  Adopted: Yes    Current Facility-Administered Medications  Medication Dose Route Frequency Provider Last Rate Last Dose  . 0.9 %  sodium chloride infusion   Intravenous Continuous Julianne Rice, MD      . acetaminophen (TYLENOL) tablet 650 mg  650 mg Oral Q6H PRN Michael Boston, MD       Or  . acetaminophen (TYLENOL) suppository 650 mg  650 mg Rectal Q6H PRN Michael Boston, MD      . acetaminophen (TYLENOL) tablet 1,000 mg  1,000 mg Oral On Call to OR Michael Boston, MD      . albuterol (PROVENTIL) (5 MG/ML) 0.5% nebulizer solution 2.5 mg  2.5 mg Nebulization Q4H PRN Michael Boston, MD      . ALPRAZolam Duanne Moron) tablet 0.25-0.5 mg  0.25-0.5 mg Oral TID PRN Michael Boston, MD      .  alum & mag hydroxide-simeth (MAALOX/MYLANTA) 200-200-20 MG/5ML suspension 30 mL  30 mL Oral Q6H PRN Michael Boston, MD      . bisacodyl (DULCOLAX) suppository 10 mg  10 mg Rectal Q12H PRN Michael Boston, MD      . carvedilol (COREG) tablet 12.5 mg  12.5 mg Oral BID Michael Boston, MD      . cefTRIAXone (ROCEPHIN) 2 g in sodium chloride 0.9 % 100 mL IVPB  2 g Intravenous On Call to OR Michael Boston, MD       And  . metroNIDAZOLE (FLAGYL) IVPB 500 mg  500 mg Intravenous On Call to OR Michael Boston, MD      . celecoxib (CELEBREX) capsule 200 mg  200 mg Oral On Call to OR Michael Boston, MD      . Chlorhexidine Gluconate Cloth 2 % PADS 6 each  6 each Topical  Once Michael Boston, MD       And  . Chlorhexidine Gluconate Cloth 2 % PADS 6 each  6 each Topical Once Michael Boston, MD      . diphenhydrAMINE (BENADRYL) 12.5 MG/5ML elixir 12.5 mg  12.5 mg Oral Q6H PRN Michael Boston, MD       Or  . diphenhydrAMINE (BENADRYL) injection 12.5 mg  12.5 mg Intravenous Q6H PRN Michael Boston, MD      . enoxaparin (LOVENOX) injection 40 mg  40 mg Subcutaneous Q24H Michael Boston, MD      . gabapentin (NEURONTIN) capsule 300 mg  300 mg Oral On Call to OR Michael Boston, MD      . gabapentin (NEURONTIN) capsule 300 mg  300 mg Oral BID Michael Boston, MD      . guaiFENesin-dextromethorphan (ROBITUSSIN DM) 100-10 MG/5ML syrup 10 mL  10 mL Oral Q4H PRN Michael Boston, MD      . hydrocortisone (ANUSOL-HC) 2.5 % rectal cream 1 application  1 application Topical QID PRN Michael Boston, MD      . hydrocortisone cream 1 % 1 application  1 application Topical TID PRN Michael Boston, MD      . hydrocortisone-pramoxine [facility]) 2.5-1 % rectal cream   Rectal TID Michael Boston, MD      . HYDROmorphone (DILAUDID) injection 0.5-2 mg  0.5-2 mg Intravenous Q2H PRN Michael Boston, MD      . ibuprofen (ADVIL,MOTRIN) tablet 400 mg  400 mg Oral Q8H PRN Michael Boston, MD      . iopamidol (ISOVUE-300) 61 % injection           . lactated ringers  bolus 1,000 mL  1,000 mL Intravenous Once Michael Boston, MD      . lactated ringers bolus 1,000 mL  1,000 mL Intravenous Q8H PRN Michael Boston, MD      . lactated ringers infusion   Intravenous Continuous Michael Boston, MD      . lip balm (CARMEX) ointment 1 application  1 application Topical BID Michael Boston, MD      . magic mouthwash  15 mL Oral QID PRN Michael Boston, MD      . menthol-cetylpyridinium (CEPACOL) lozenge 3 mg  1 lozenge Oral PRN Michael Boston, MD      . methocarbamol (ROBAXIN) 1,000 mg in dextrose 5 % 50 mL IVPB  1,000 mg Intravenous Q6H PRN Michael Boston, MD      . methocarbamol (ROBAXIN) tablet 1,000 mg  1,000 mg Oral Q6H PRN Michael Boston, MD      . metoCLOPramide (REGLAN) injection 10 mg  10 mg Intravenous Q6H PRN Michael Boston, MD      . multivitamin with minerals tablet 1 tablet  1 tablet Oral Daily Michael Boston, MD      . nicotine (NICODERM CQ - dosed in mg/24 hours) patch 14 mg  14 mg Transdermal Daily Michael Boston, MD      . ondansetron (ZOFRAN-ODT) disintegrating tablet 4 mg  4 mg Oral Q6H PRN Michael Boston, MD       Or  . ondansetron (ZOFRAN) injection 4 mg  4 mg Intravenous Q6H PRN Michael Boston, MD      . oxyCODONE (Oxy IR/ROXICODONE) immediate release tablet 5-10 mg  5-10 mg Oral Q4H PRN Michael Boston, MD      . phenol (CHLORASEPTIC) mouth spray 1-2 spray  1-2 spray Mouth/Throat PRN Michael Boston, MD      . piperacillin-tazobactam (ZOSYN) IVPB 3.375 g  3.375 g  Intravenous Tor Netters, MD      . prochlorperazine (COMPAZINE) injection 5-10 mg  5-10 mg Intravenous Q4H PRN Michael Boston, MD      . simethicone [facility]) chewable tablet 40 mg  40 mg Oral Q6H PRN Michael Boston, MD      . sodium chloride 0.9 % injection           . venlafaxine XR (EFFEXOR-XR) 24 hr capsule 150 mg  150 mg Oral Daily Michael Boston, MD       Current Outpatient Medications  Medication Sig Dispense Refill  . ALPRAZolam (XANAX) 0.25 MG tablet Take 0.25-0.5 mg by mouth 3 (three) times  daily as needed for anxiety. For anxiety.    . carvedilol (COREG) 12.5 MG tablet Take 12.5 mg by mouth 2 (two) times daily.     . hydrochlorothiazide (MICROZIDE) 12.5 MG capsule Take 12.5 mg by mouth daily.    Marland Kitchen ibuprofen (ADVIL,MOTRIN) 200 MG tablet Take 400 mg by mouth every 8 (eight) hours as needed for headache, mild pain or moderate pain. For pain.     . Multiple Vitamin (MULTIVITAMIN WITH MINERALS) TABS Take 1 tablet by mouth daily.    Marland Kitchen venlafaxine XR (EFFEXOR-XR) 75 MG 24 hr capsule Take 150 mg by mouth daily.     . hydrocortisone-pramoxine (ANALPRAM-HC) 2.5-1 % rectal cream Place rectally 3 (three) times daily. (Patient not taking: Reported on 04/11/2017) 30 g 0     Allergies  Allergen Reactions  . Morphine And Related Itching    ROS:   All other systems reviewed & are negative except per HPI or as noted below: Constitutional:  No fevers, chills, sweats.  Weight stable Eyes:  No vision changes, No discharge HENT:  No sore throats, nasal drainage Lymph: No neck swelling, No bruising easily Pulmonary:  No cough, productive sputum CV: No orthopnea, PND  Patient walks 180 minutes for about 5 miles without difficulty.  No exertional chest/neck/shoulder/arm pain. GI: No personal nor family history of GI/colon cancer, inflammatory bowel disease, irritable bowel syndrome, allergy such as Celiac Sprue, dietary/dairy problems, colitis, ulcers nor gastritis.  No recent sick contacts/gastroenteritis.  No travel outside the country.  No changes in diet.  History of colon polyps.  Had 5-year follow-up without any new polyps more recently Renal: No UTIs, No hematuria Genital:  No drainage, bleeding, masses Musculoskeletal: No severe joint pain.  Good ROM major joints Skin:  No sores or lesions.  No rashes Heme/Lymph:  No easy bleeding.  No swollen lymph nodes Neuro: No focal weakness/numbness.  No seizures Psych: No suicidal ideation.  No hallucinations  BP 117/78   Pulse (!) 101   Temp 98.3  F (36.8 C) (Oral)   Resp 18   LMP 04/11/2012   SpO2 96%   Physical Exam: General: Pt awake/alert/oriented x4 in mild major acute distress.  Tired.  Not toxic. Eyes: PERRL, normal EOM. Sclera nonicteric Neuro: CN II-XII intact w/o focal sensory/motor deficits. Lymph: No head/neck/groin lymphadenopathy Psych:  No delerium/psychosis/paranoia HENT: Normocephalic, Mucus membranes moist.  No thrush Neck: Supple, No tracheal deviation Chest: No pain.  Good respiratory excursion. CV:  Pulses intact.  Regular rhythm Abdomen: Soft, Nondistended.  Focal tenderness in right lower quadrant especially with reproduction with rebound tenderness and cough.  Left lower quadrant and rest of the abdomen nontender.  No incarcerated hernias. Gen:  No inguinal hernias.  No inguinal lymphadenopathy.   Ext:  SCDs BLE.  No significant edema.  No cyanosis Skin: No petechiae /  purpurea.  No major sores Musculoskeletal: No severe joint pain.  Good ROM major joints   Results:   Labs: Results for orders placed or performed during the hospital encounter of 04/11/17 (from the past 48 hour(s))  Lipase, blood     Status: None   Collection Time: 04/11/17  9:23 PM  Result Value Ref Range   Lipase 27 11 - 51 U/L    Comment: Performed at [facility] [place] [address] [place], [place] [address]  Comprehensive metabolic panel     Status: Abnormal   Collection Time: 04/11/17  9:23 PM  Result Value Ref Range   Sodium 137 135 - 145 mmol/L   Potassium 3.6 3.5 - 5.1 mmol/L   Chloride 99 (L) 101 - 111 mmol/L   CO2 26 22 - 32 mmol/L   Glucose, Bld 113 (H) 65 - 99 mg/dL   BUN 13 6 - 20 mg/dL   Creatinine, Ser 0.98 0.44 - 1.00 mg/dL   Calcium 9.6 8.9 - 10.3 mg/dL   Total Protein 7.6 6.5 - 8.1 g/dL   Albumin 4.4 3.5 - 5.0 g/dL   AST 19 15 - 41 U/L   ALT 16 14 - 54 U/L   Alkaline Phosphatase 66 38 - 126 U/L   Total Bilirubin 0.6 0.3 - 1.2 mg/dL   GFR calc non Af Amer >60 >60 mL/min   GFR calc Af  Amer >60 >60 mL/min    Comment: (NOTE) The eGFR has been calculated using the CKD EPI equation. This calculation has not been validated in all clinical situations. eGFR's persistently <60 mL/min signify possible Chronic Kidney Disease.    Anion gap 12 5 - 15    Comment: Performed at [facility], [place] [address] [place], [place] [address]  CBC     Status: Abnormal   Collection Time: 04/11/17  9:23 PM  Result Value Ref Range   WBC 12.1 (H) 4.0 - 10.5 K/uL   RBC 4.24 3.87 - 5.11 MIL/uL   Hemoglobin 15.0 12.0 - 15.0 g/dL   HCT 43.2 36.0 - 46.0 %   MCV 101.9 (H) 78.0 - 100.0 fL   MCH 35.4 (H) 26.0 - 34.0 pg   MCHC 34.7 30.0 - 36.0 g/dL   RDW 12.2 11.5 - 15.5 %   Platelets 296 150 - 400 K/uL    Comment: Performed at [facility], [place] [address] [place], [place] [address]  I-Stat beta hCG blood, ED     Status: Abnormal   Collection Time: 04/11/17  9:35 PM  Result Value Ref Range   I-stat hCG, quantitative 7.2 (H) <5 mIU/mL   Comment 3            Comment:   GEST. AGE      CONC.  (mIU/mL)   <=1 WEEK        5 - 50     2 WEEKS       50 - 500     3 WEEKS       100 - 10,000     4 WEEKS     1,000 - 30,000        FEMALE AND NON-PREGNANT FEMALE:     LESS THAN 5 mIU/mL   Urinalysis, Routine w reflex microscopic     Status: Abnormal   Collection Time: 04/11/17 10:13 PM  Result Value Ref Range   Color, Urine YELLOW YELLOW   APPearance CLEAR CLEAR   Specific Gravity, Urine 1.011  1.005 - 1.030   pH 7.0 5.0 - 8.0   Glucose, UA NEGATIVE NEGATIVE mg/dL   Hgb urine dipstick SMALL (A) NEGATIVE   Bilirubin Urine NEGATIVE NEGATIVE   Ketones, ur NEGATIVE NEGATIVE mg/dL   Protein, ur NEGATIVE NEGATIVE mg/dL   Nitrite NEGATIVE NEGATIVE   Leukocytes, UA NEGATIVE NEGATIVE   RBC / HPF 0-5 0 - 5 RBC/hpf   WBC, UA 0-5 0 - 5 WBC/hpf   Bacteria, UA RARE (A) NONE SEEN   Squamous Epithelial / LPF 0-5 (A) NONE SEEN   Mucus PRESENT    Hyaline Casts, UA PRESENT      Comment: Performed at [facility], [place] [address], [place], [place] [address]    Imaging / Studies: Ct Abdomen Pelvis W Contrast  Result Date: 04/11/2017 CLINICAL DATA:  Lower abdominal pain EXAM: CT ABDOMEN AND PELVIS WITH CONTRAST TECHNIQUE: Multidetector CT imaging of the abdomen and pelvis was performed using the standard protocol following bolus administration of intravenous contrast. CONTRAST:  155m ISOVUE-300 IOPAMIDOL (ISOVUE-300) INJECTION 61%, <See Chart> ISOVUE-300 IOPAMIDOL (ISOVUE-300) INJECTION 61% COMPARISON:  None. FINDINGS: Lower chest: Lung bases demonstrate no acute consolidation or effusion. Normal heart size. Hepatobiliary: No focal liver abnormality is seen. No gallstones, gallbladder wall thickening, or biliary dilatation. Pancreas: Unremarkable. No pancreatic ductal dilatation or surrounding inflammatory changes. Spleen: Normal in size without focal abnormality. Adrenals/Urinary Tract: Right adrenal gland is normal. 2.3 cm indeterminate left adrenal mass. Exophytic 12 mm hypodensity upper pole left kidney, slight increased density values. No hydronephrosis. The bladder is normal Stomach/Bowel: The stomach is nonenlarged. Thickened appearance of right lower quadrant distal ileal loops with mucosal enhancement. Appendix slightly enlarged, measuring up to 9 mm in size. Considerable inflammation around the appendix with the tip terminating in an inflammatory soft tissue density that is contiguous with the right adnexa, series 2, image number 61. No extraluminal gas. No stone. Vascular/Lymphatic: Moderate aortic atherosclerosis. No aneurysmal dilatation. No significantly enlarged lymph nodes. Reproductive: Uterus unremarkable. Inflammatory changes in the right adnexa with 19 mm inflammatory right adnexal mass. Other: Negative for free air or free fluid. Musculoskeletal: Trace anterolisthesis of L4 on L5. No acute or suspicious lesion. IMPRESSION: 1. Right lower quadrant  inflammatory process. Appendix is slightly enlarged up to 9 mm in size with enhancement present. The tip of the appendix appears contiguous with a 19 mm inflammatory mass centered in the right adnexa, adjacent to the right ovary. Differential considerations include appendicitis with tip perforation and adjacent phlegmon. Given close proximity to the adnexa/right ovary, could also consider PID with secondary inflammation of the appendix or other cause of inflammatory adnexal mass such as endometriosis. No free air. Appendix: Location: Right lower quadrant Diameter: 9 mm Appendicolith: Not seen Mucosal hyper-enhancement: Present Extraluminal gas: Not seen Periappendiceal collection: No significant rim enhancing abscess. Possible phlegmon adjacent to the tip of the appendix. 1. Thickened right lower quadrant distal ileal loops are likely reactive 2. Indeterminate 2.3 cm left adrenal mass. Recommend further evaluation with nonemergent adrenal CT. 3. 12 mm intermediate density arising from the upper pole of left kidney. Could be further evaluated with multiphasic CT or MRI, nonemergently. Electronically Signed   By: KDonavan FoilM.D.   On: 04/11/2017 23:23    Medications / Allergies: per chart  Antibiotics: Anti-infectives (From admission, onward)   Start     Dose/Rate Route Frequency Ordered Stop   04/12/17 0600  cefTRIAXone (ROCEPHIN) 2 g in sodium chloride 0.9 % 100 mL IVPB  2 g 200 mL/hr over 30 Minutes Intravenous On call to O.R. 04/12/17 0032 04/13/17 0559   04/12/17 0600  metroNIDAZOLE (FLAGYL) IVPB 500 mg     500 mg 100 mL/hr over 60 Minutes Intravenous On call to O.R. 04/12/17 0032 04/13/17 0559   04/12/17 0045  piperacillin-tazobactam (ZOSYN) IVPB 3.375 g     3.375 g 12.5 mL/hr over 240 Minutes Intravenous Every 8 hours 04/12/17 0032          Note: Portions of this report may have been transcribed using voice recognition software. Every effort was made to ensure accuracy; however,  inadvertent computerized transcription errors may be present.   Any transcriptional errors that result from this process are unintentional.    Adin Hector, M.D., F.A.C.S. Gastrointestinal and Minimally Invasive Surgery [facility] [address] [place], [place] [address] [phone] Main / Paging   04/12/2017

## 2017-04-12 NOTE — Anesthesia Procedure Notes (Signed)
Procedure Name: Intubation Date/Time: 04/12/2017 8:04 AM Performed by: West Pugh, CRNA Pre-anesthesia Checklist: Patient identified, Emergency Drugs available, Suction available, Patient being monitored and Timeout performed Patient Re-evaluated:Patient Re-evaluated prior to induction Oxygen Delivery Method: Circle system utilized Preoxygenation: Pre-oxygenation with 100% oxygen Induction Type: IV induction Ventilation: Mask ventilation without difficulty Laryngoscope Size: Mac and 3 Grade View: Grade I Tube type: Oral Tube size: 7.5 mm Number of attempts: 1 Airway Equipment and Method: Stylet Placement Confirmation: ETT inserted through vocal cords under direct vision,  positive ETCO2,  CO2 detector and breath sounds checked- equal and bilateral Secured at: 22 cm Tube secured with: Tape Dental Injury: Teeth and Oropharynx as per pre-operative assessment

## 2017-04-12 NOTE — Anesthesia Preprocedure Evaluation (Addendum)
Anesthesia Evaluation  Patient identified by MRN, date of birth, ID band Patient awake    Reviewed: Allergy & Precautions, NPO status , Patient's Chart, lab work & pertinent test results  Airway Mallampati: II  TM Distance: >3 FB Neck ROM: Full    Dental no notable dental hx.    Pulmonary Current Smoker,    Pulmonary exam normal breath sounds clear to auscultation       Cardiovascular hypertension, Pt. on medications and Pt. on home beta blockers Normal cardiovascular exam Rhythm:Regular Rate:Normal     Neuro/Psych  Headaches, PSYCHIATRIC DISORDERS Anxiety Depression    GI/Hepatic negative GI ROS, Neg liver ROS,   Endo/Other  negative endocrine ROS  Renal/GU negative Renal ROS     Musculoskeletal   Abdominal   Peds  Hematology HLD   Anesthesia Other Findings Appendicitis  Reproductive/Obstetrics                            Anesthesia Physical Anesthesia Plan  ASA: III  Anesthesia Plan: General   Post-op Pain Management:    Induction: Intravenous  PONV Risk Score and Plan: 2 and Ondansetron, Dexamethasone, Midazolam and Treatment may vary due to age or medical condition  Airway Management Planned: Oral ETT  Additional Equipment:   Intra-op Plan:   Post-operative Plan: Extubation in OR  Informed Consent: I have reviewed the patients History and Physical, chart, labs and discussed the procedure including the risks, benefits and alternatives for the proposed anesthesia with the patient or authorized representative who has indicated his/her understanding and acceptance.   Dental advisory given  Plan Discussed with: CRNA  Anesthesia Plan Comments:         Anesthesia Quick Evaluation

## 2017-04-12 NOTE — Transfer of Care (Signed)
Immediate Anesthesia Transfer of Care Note  Patient: Adrienne [name]  Procedure(s) Performed: APPENDECTOMY LAPAROSCOPIC (N/A Abdomen)  Patient Location: PACU  Anesthesia Type:General  Level of Consciousness: awake, alert  and patient cooperative  Airway & Oxygen Therapy: Patient Spontanous Breathing and Patient connected to face mask oxygen  Post-op Assessment: Report given to RN and Post -op Vital signs reviewed and stable  Post vital signs: Reviewed and stable  Last Vitals:  Vitals:   04/12/17 0119 04/12/17 0513  BP: 90/68 97/68  Pulse: 100 77  Resp: 18 18  Temp: 36.9 C (!) 36.4 C  SpO2: 93% 92%    Last Pain:  Vitals:   04/12/17 0513  TempSrc: Oral  PainSc:          Complications: No apparent anesthesia complications

## 2017-04-12 NOTE — ED Notes (Signed)
ED TO INPATIENT HANDOFF REPORT  Name/Age/Gender Adrienne [name] 57 y.o. female  Code Status    Code Status Orders  (From admission, onward)        Start     Ordered   04/12/17 0028  Full code  Continuous     04/12/17 0032    Code Status History    Date Active Date Inactive Code Status Order ID Comments User Context   This patient has a current code status but no historical code status.      Home/SNF/Other Home  Chief Complaint poss appendicitis sent by urgent care  Level of Care/Admitting Diagnosis ED Disposition    ED Disposition Condition Comment   Admit  Hospital Area: [facility] [[id number]]  Level of Care: Med-Surg [16]  Diagnosis: Acute appendicitis with peritoneal abscess [540.1.ICD-9-CM]  Admitting Physician: [place], [place]  Attending Physician: CCS, MD [[id number]]  Estimated length of stay: past midnight tomorrow  Certification:: I certify this patient will need inpatient services for at least 2 midnights  PT Class (Do Not Modify): Inpatient [101]  PT Acc Code (Do Not Modify): Private [1]       Medical History Past Medical History:  Diagnosis Date  . Anxiety    EFFEXOR-DR. FRED WILSON  . Cervical dysplasia   . Elevated cholesterol   . Endometriosis   . Migraine with aura 12/2006   COULD NOT SPEAK, pt has aura without migraines  . Ovarian cyst     Allergies Allergies  Allergen Reactions  . Morphine And Related Itching    IV Location/Drains/Wounds Patient Lines/Drains/Airways Status   Active Line/Drains/Airways    Name:   Placement date:   Placement time:   Site:   Days:   Peripheral IV 04/11/17 Right Forearm   04/11/17    2124    Forearm   1          Labs/Imaging Results for orders placed or performed during the hospital encounter of 04/11/17 (from the past 48 hour(s))  Lipase, blood     Status: None   Collection Time: 04/11/17  9:23 PM  Result Value Ref Range   Lipase 27 11 - 51 U/L    Comment: Performed at [facility], [place] [address], [place], [place] [address]  Comprehensive metabolic panel     Status: Abnormal   Collection Time: 04/11/17  9:23 PM  Result Value Ref Range   Sodium 137 135 - 145 mmol/L   Potassium 3.6 3.5 - 5.1 mmol/L   Chloride 99 (L) 101 - 111 mmol/L   CO2 26 22 - 32 mmol/L   Glucose, Bld 113 (H) 65 - 99 mg/dL   BUN 13 6 - 20 mg/dL   Creatinine, Ser 0.98 0.44 - 1.00 mg/dL   Calcium 9.6 8.9 - 10.3 mg/dL   Total Protein 7.6 6.5 - 8.1 g/dL   Albumin 4.4 3.5 - 5.0 g/dL   AST 19 15 - 41 U/L   ALT 16 14 - 54 U/L   Alkaline Phosphatase 66 38 - 126 U/L   Total Bilirubin 0.6 0.3 - 1.2 mg/dL   GFR calc non Af Amer >60 >60 mL/min   GFR calc Af Amer >60 >60 mL/min    Comment: (NOTE) The eGFR has been calculated using the CKD EPI equation. This calculation has not been validated in all clinical situations. eGFR's persistently <60 mL/min signify possible Chronic Kidney Disease.    Anion gap 12 5 - 15  Comment: Performed at [facility], [place] [address], [place], [place] [address]  CBC     Status: Abnormal   Collection Time: 04/11/17  9:23 PM  Result Value Ref Range   WBC 12.1 (H) 4.0 - 10.5 K/uL   RBC 4.24 3.87 - 5.11 MIL/uL   Hemoglobin 15.0 12.0 - 15.0 g/dL   HCT 43.2 36.0 - 46.0 %   MCV 101.9 (H) 78.0 - 100.0 fL   MCH 35.4 (H) 26.0 - 34.0 pg   MCHC 34.7 30.0 - 36.0 g/dL   RDW 12.2 11.5 - 15.5 %   Platelets 296 150 - 400 K/uL    Comment: Performed at [facility], [place] [address], [place], [place] [address]  I-Stat beta hCG blood, ED     Status: Abnormal   Collection Time: 04/11/17  9:35 PM  Result Value Ref Range   I-stat hCG, quantitative 7.2 (H) <5 mIU/mL   Comment 3            Comment:   GEST. AGE      CONC.  (mIU/mL)   <=1 WEEK        5 - 50     2 WEEKS       50 - 500     3 WEEKS       100 - 10,000     4 WEEKS     1,000 - 30,000        FEMALE AND NON-PREGNANT FEMALE:     LESS THAN 5 mIU/mL   Urinalysis,  Routine w reflex microscopic     Status: Abnormal   Collection Time: 04/11/17 10:13 PM  Result Value Ref Range   Color, Urine YELLOW YELLOW   APPearance CLEAR CLEAR   Specific Gravity, Urine 1.011 1.005 - 1.030   pH 7.0 5.0 - 8.0   Glucose, UA NEGATIVE NEGATIVE mg/dL   Hgb urine dipstick SMALL (A) NEGATIVE   Bilirubin Urine NEGATIVE NEGATIVE   Ketones, ur NEGATIVE NEGATIVE mg/dL   Protein, ur NEGATIVE NEGATIVE mg/dL   Nitrite NEGATIVE NEGATIVE   Leukocytes, UA NEGATIVE NEGATIVE   RBC / HPF 0-5 0 - 5 RBC/hpf   WBC, UA 0-5 0 - 5 WBC/hpf   Bacteria, UA RARE (A) NONE SEEN   Squamous Epithelial / LPF 0-5 (A) NONE SEEN   Mucus PRESENT    Hyaline Casts, UA PRESENT     Comment: Performed at [facility], [place] [address] [place], [place] [address]   Ct Abdomen Pelvis W Contrast  Result Date: 04/11/2017 CLINICAL DATA:  Lower abdominal pain EXAM: CT ABDOMEN AND PELVIS WITH CONTRAST TECHNIQUE: Multidetector CT imaging of the abdomen and pelvis was performed using the standard protocol following bolus administration of intravenous contrast. CONTRAST:  122m ISOVUE-300 IOPAMIDOL (ISOVUE-300) INJECTION 61%, <See Chart> ISOVUE-300 IOPAMIDOL (ISOVUE-300) INJECTION 61% COMPARISON:  None. FINDINGS: Lower chest: Lung bases demonstrate no acute consolidation or effusion. Normal heart size. Hepatobiliary: No focal liver abnormality is seen. No gallstones, gallbladder wall thickening, or biliary dilatation. Pancreas: Unremarkable. No pancreatic ductal dilatation or surrounding inflammatory changes. Spleen: Normal in size without focal abnormality. Adrenals/Urinary Tract: Right adrenal gland is normal. 2.3 cm indeterminate left adrenal mass. Exophytic 12 mm hypodensity upper pole left kidney, slight increased density values. No hydronephrosis. The bladder is normal Stomach/Bowel: The stomach is nonenlarged. Thickened appearance of right lower quadrant distal ileal loops with mucosal enhancement.  Appendix slightly enlarged, measuring up to 9 mm in size. Considerable inflammation  around the appendix with the tip terminating in an inflammatory soft tissue density that is contiguous with the right adnexa, series 2, image number 61. No extraluminal gas. No stone. Vascular/Lymphatic: Moderate aortic atherosclerosis. No aneurysmal dilatation. No significantly enlarged lymph nodes. Reproductive: Uterus unremarkable. Inflammatory changes in the right adnexa with 19 mm inflammatory right adnexal mass. Other: Negative for free air or free fluid. Musculoskeletal: Trace anterolisthesis of L4 on L5. No acute or suspicious lesion. IMPRESSION: 1. Right lower quadrant inflammatory process. Appendix is slightly enlarged up to 9 mm in size with enhancement present. The tip of the appendix appears contiguous with a 19 mm inflammatory mass centered in the right adnexa, adjacent to the right ovary. Differential considerations include appendicitis with tip perforation and adjacent phlegmon. Given close proximity to the adnexa/right ovary, could also consider PID with secondary inflammation of the appendix or other cause of inflammatory adnexal mass such as endometriosis. No free air. Appendix: Location: Right lower quadrant Diameter: 9 mm Appendicolith: Not seen Mucosal hyper-enhancement: Present Extraluminal gas: Not seen Periappendiceal collection: No significant rim enhancing abscess. Possible phlegmon adjacent to the tip of the appendix. 1. Thickened right lower quadrant distal ileal loops are likely reactive 2. Indeterminate 2.3 cm left adrenal mass. Recommend further evaluation with nonemergent adrenal CT. 3. 12 mm intermediate density arising from the upper pole of left kidney. Could be further evaluated with multiphasic CT or MRI, nonemergently. Electronically Signed   By: Donavan Foil M.D.   On: 04/11/2017 23:23    Pending Labs Unresulted Labs (From admission, onward)   Start     Ordered   04/19/17 0500   Creatinine, serum  (enoxaparin (LOVENOX)    CrCl >/= 30 ml/min)  Weekly,   R    Comments:  while on enoxaparin therapy    04/12/17 0032   04/12/17 0028  HIV antibody (Routine Testing)  Once,   R     04/12/17 0032      Vitals/Pain Today's Vitals   04/11/17 2211 04/11/17 2304 04/11/17 2330 04/12/17 0000  BP:  127/75 107/69 117/78  Pulse:  (!) 101 100 (!) 101  Resp:  18    Temp:      TempSrc:      SpO2:  97% (!) 87% 96%  PainSc: 0-No pain       Isolation Precautions No active isolations  Medications Medications  iopamidol (ISOVUE-300) 61 % injection (  Canceled Entry 04/11/17 2245)  sodium chloride 0.9 % injection (not administered)  0.9 %  sodium chloride infusion (not administered)  enoxaparin (LOVENOX) injection 40 mg (not administered)  lactated ringers infusion (not administered)  acetaminophen (TYLENOL) tablet 650 mg (not administered)    Or  acetaminophen (TYLENOL) suppository 650 mg (not administered)  diphenhydrAMINE (BENADRYL) 12.5 MG/5ML elixir 12.5 mg (not administered)    Or  diphenhydrAMINE (BENADRYL) injection 12.5 mg (not administered)  ondansetron (ZOFRAN-ODT) disintegrating tablet 4 mg (not administered)    Or  ondansetron (ZOFRAN) injection 4 mg (not administered)  simethicone (MYLICON) chewable tablet 40 mg (not administered)  piperacillin-tazobactam (ZOSYN) IVPB 3.375 g (not administered)  Chlorhexidine Gluconate Cloth 2 % PADS 6 each (not administered)    And  Chlorhexidine Gluconate Cloth 2 % PADS 6 each (not administered)  cefTRIAXone (ROCEPHIN) 2 g in sodium chloride 0.9 % 100 mL IVPB (not administered)    And  metroNIDAZOLE (FLAGYL) IVPB 500 mg (not administered)  gabapentin (NEURONTIN) capsule 300 mg (not administered)  acetaminophen (TYLENOL) tablet 1,000 mg (not administered)  celecoxib (CELEBREX) capsule 200 mg (not administered)  lactated ringers bolus 1,000 mL (not administered)  lactated ringers bolus 1,000 mL (not administered)   oxyCODONE (Oxy IR/ROXICODONE) immediate release tablet 5-10 mg (not administered)  methocarbamol (ROBAXIN) 1,000 mg in dextrose 5 % 50 mL IVPB (not administered)  methocarbamol (ROBAXIN) tablet 1,000 mg (not administered)  gabapentin (NEURONTIN) capsule 300 mg (not administered)  HYDROmorphone (DILAUDID) injection 0.5-2 mg (not administered)  prochlorperazine (COMPAZINE) injection 5-10 mg (not administered)  metoCLOPramide (REGLAN) injection 10 mg (not administered)  lip balm (CARMEX) ointment 1 application (not administered)  magic mouthwash (not administered)  bisacodyl (DULCOLAX) suppository 10 mg (not administered)  guaiFENesin-dextromethorphan (ROBITUSSIN DM) 100-10 MG/5ML syrup 10 mL (not administered)  hydrocortisone (ANUSOL-HC) 2.5 % rectal cream 1 application (not administered)  alum & mag hydroxide-simeth (MAALOX/MYLANTA) 200-200-20 MG/5ML suspension 30 mL (not administered)  hydrocortisone cream 1 % 1 application (not administered)  menthol-cetylpyridinium (CEPACOL) lozenge 3 mg (not administered)  phenol (CHLORASEPTIC) mouth spray 1-2 spray (not administered)  nicotine (NICODERM CQ - dosed in mg/24 hours) patch 14 mg (not administered)  albuterol (PROVENTIL) (5 MG/ML) 0.5% nebulizer solution 2.5 mg (not administered)  ALPRAZolam (XANAX) tablet 0.25-0.5 mg (not administered)  carvedilol (COREG) tablet 12.5 mg (not administered)  multivitamin with minerals tablet 1 tablet (not administered)  venlafaxine XR (EFFEXOR-XR) 24 hr capsule 150 mg (not administered)  ibuprofen (ADVIL,MOTRIN) tablet 400 mg (not administered)  hydrocortisone-pramoxine (ANALPRAM-HC) 2.5-1 % rectal cream (not administered)  fentaNYL (SUBLIMAZE) injection 50 mcg (50 mcg Intravenous Given 04/11/17 2131)  sodium chloride 0.9 % bolus 1,000 mL (0 mLs Intravenous Stopped 04/11/17 2219)  iopamidol (ISOVUE-300) 61 % injection 100 mL (100 mLs Intravenous Contrast Given 04/11/17 2242)    Mobility walks

## 2017-04-12 NOTE — Progress Notes (Signed)
Consent for surgery signed by patient. This Clinical research associatewriter acknowledges patient's signature only

## 2017-04-12 NOTE — Anesthesia Postprocedure Evaluation (Signed)
Anesthesia Post Note  Patient: Adrienne [name]  Procedure(s) Performed: APPENDECTOMY LAPAROSCOPIC (N/A Abdomen)     Patient location during evaluation: PACU Anesthesia Type: General Level of consciousness: awake and alert Pain management: pain level controlled Vital Signs Assessment: post-procedure vital signs reviewed and stable Respiratory status: spontaneous breathing, nonlabored ventilation, respiratory function stable and patient connected to nasal cannula oxygen Cardiovascular status: blood pressure returned to baseline and stable Postop Assessment: no apparent nausea or vomiting Anesthetic complications: no    Last Vitals:  Vitals:   04/12/17 1230 04/12/17 1354  BP: 132/72 114/73  Pulse: 74 76  Resp: 16 18  Temp: 36.7 C 36.7 C  SpO2: 98% 100%    Last Pain:  Vitals:   04/12/17 1354  TempSrc: Oral  PainSc:                  Masen Luallen P Jazyah Butsch

## 2017-04-12 NOTE — Op Note (Signed)
PATIENT:  Adrienne [name]  57 y.o. female  Patient Care Team: Barbie Banner, MD as PCP - General (Family Medicine)  PRE-OPERATIVE DIAGNOSIS: Pelvic abscess.  Perforated appendicitis versus tubo-ovarian abscess.  POST-OPERATIVE DIAGNOSIS:  Perforated appendicitis with suppuration  PROCEDURE:   APPENDECTOMY LAPAROSCOPIC  SURGEON:  Ardeth Sportsman, MD  ASSIST: Susa Griffins, RNFA  ANESTHESIA:   local and general  EBL:  Total I/O In: 600 [I.V.:600] Out: -   Delay start of Pharmacological VTE agent (>24hrs) due to surgical blood loss or risk of bleeding:  no  DRAINS: none   SPECIMEN:  APPENDIX  DISPOSITION OF SPECIMEN:  PATHOLOGY  COUNTS:  YES  PLAN OF CARE: Admit to inpatient   PATIENT DISPOSITION:  PACU - hemodynamically stable.   INDICATIONS: Patient with concerning symptoms & work up suspicious for appendicitis.  Surgery was recommended:  The anatomy & physiology of the digestive tract was discussed.  The pathophysiology of appendicitis was discussed.  Natural history risks without surgery was discussed.   I feel the risks of no intervention will lead to serious problems that outweigh the operative risks; therefore, I recommended diagnostic laparoscopy with removal of appendix to remove the pathology.  Laparoscopic & open techniques were discussed.   I noted a good likelihood this will help address the problem.    Risks such as bleeding, infection, abscess, leak, reoperation, possible ostomy, hernia, heart attack, death, and other risks were discussed.  Goals of post-operative recovery were discussed as well.  We will work to minimize complications.  Questions were answered.  The patient expresses understanding & wishes to proceed with surgery.  OR FINDINGS: Homero Fellers pus bathing pelvis with mid appendiceal perforation and small stool ball in peritoneum.  Focal gangrene.  Remaining right adnexa it adherent to appendix but viable and intact.  Left in place.  No  cholecystitis.  No Meckel's diverticulum.  No Crohn's ileitis.  No tubo-ovarian abscess.  Fallopian tube uterus and ovary normal.  DESCRIPTION:   The patient was identified & brought into the operating room. The patient was positioned supine with arms tucked. SCDs were active during the entire case. The patient underwent general anesthesia without any difficulty.  The abdomen was prepped and draped in a sterile fashion. A Surgical Timeout confirmed our plan.  I made a transverse incision through the superior umbilical fold.  I made a small transverse nick through the infraumbilical fascia and confirmed peritoneal entry.  I placed a 5mm port.  We induced carbon dioxide insufflation.  Camera inspection revealed no injury.  I placed additional ports under direct laparoscopic visualization.  There was frank pus bathing the uterus and rectosigmoid region.  This was aspirated.  Could look over and see a inflamed phlegmon in the ileocecal region suspicious for appendicitis.  Remaining right adnexa it adherent to it.  I could a small pea size paste of stool in this region coming out a narrow tubular structure in the ileocecal region consistent with perforated appendicitis.  I mobilized the terminal ileum to proximal ascending colon in a lateral to medial fashion.  I was able to free the ileocecal region and perforated appendix off the right adnexa and leave the fallopian tube and ovary intact.  Some secondary inflammation but no tubo-ovarian abscess.  I took care to avoid injuring any retroperitoneal structures.   I freed the appendix off its attachments to the ascending colon and cecal mesentery.  Confirmed a mid appendiceal pocket of gangrene and perforation.  I created a window  in the mesoappendix and transected the mesoappendix off the ileocecal mesentery.  I elevated the appendix. I skeletonized the mesoappendix. I was able to free off the base of the appendix which was still viable.  I stapled the appendix off  the cecum using a laparoscopic stapler. I took a healthy cuff of viable cecum. I placed the appendix inside an EcoSac bag.  I did copious irrigation.  Over 4 L.  Hemostasis was good in the mesoappendix, colon mesentery, and retroperitoneum. Staple line was intact on the cecum with no bleeding. I repeatedly washed out the pelvis, retrohepatic space and right paracolic gutter. I washed out the left side as well.  Hemostasis is good. There was no perforation or injury. Because the area cleaned up well after irrigation, I did not place a drain. I did a final irrigation of antibiotic solution entheses clindamycin/gentamicin).  I then removed the appendix inside the eco-sac bag.and removed out the 12 mm port site in the left suprapubic region.   I closed the 12 mm stapler port site left suprapubic region with interrupted #1 PDS suture.  Did inspection confirmed no injury or other abnormality.  I aspirated the antibiotic solution and the remaining carbon dioxide. I removed the ports.  I closed skin using 4-0 monocryl stitch.  Sterile dressings applied.  Patient was extubated and sent to the recovery room.  I discussed the operative findings with the patient's spouse. I suspect the patient is going used in the hospital at least overnight and will need antibiotics for 5 days. Questions answered. He expressed understanding and appreciation.  Ardeth SportsmanSteven C. Jerika Wales, M.D., F.A.C.S. Gastrointestinal and Minimally Invasive Surgery [facility] [address] [place], [place] [id number] [phone] Main / Paging  04/12/2017 9:22 AM

## 2017-04-13 ENCOUNTER — Encounter (HOSPITAL_COMMUNITY): Payer: Self-pay | Admitting: Surgery

## 2017-04-13 MED ORDER — AMOXICILLIN-POT CLAVULANATE 875-125 MG PO TABS: 1.0000 | ORAL_TABLET | Freq: Two times a day (BID) | ORAL | 1 refills | Status: DC

## 2017-04-13 MED ORDER — AMOXICILLIN-POT CLAVULANATE 875-125 MG PO TABS
1.0000 | ORAL_TABLET | Freq: Two times a day (BID) | ORAL | Status: DC
  Administered 2017-04-13: 1 via ORAL
  Filled 2017-04-13: qty 1

## 2017-04-13 NOTE — Progress Notes (Signed)
Assessment unchanged. Pt and husband verbalized understanding of dc instructions through teach back including follow up care and when to call the doctor. Script x 2 given as provided by MD. Discharged via wc to front entrance accompanied by NT and husband.

## 2017-04-13 NOTE — Discharge Instructions (Signed)
SURGERY: POST OP INSTRUCTIONS (Surgery for small bowel obstruction, colon resection, etc)   ######################################################################  EAT Gradually transition to a high fiber diet with a fiber supplement over the next few days after discharge  WALK Walk an hour a day.  Control your pain to do that.    CONTROL PAIN Control pain so that you can walk, sleep, tolerate sneezing/coughing, go up/down stairs.  HAVE A BOWEL MOVEMENT DAILY Keep your bowels regular to avoid problems.  OK to try a laxative to override constipation.  OK to use an antidairrheal to slow down diarrhea.  Call if not better after 2 tries  CALL IF YOU HAVE PROBLEMS/CONCERNS Call if you are still struggling despite following these instructions. Call if you have concerns not answered by these instructions  ######################################################################   DIET Follow a light diet the first few days at home.  Start with a bland diet such as soups, liquids, starchy foods, low fat foods, etc.  If you feel full, bloated, or constipated, stay on a ful liquid or pureed/blenderized diet for a few days until you feel better and no longer constipated. Be sure to drink plenty of fluids every day to avoid getting dehydrated (feeling dizzy, not urinating, etc.). Gradually add a fiber supplement to your diet over the next week.  Gradually get back to a regular solid diet.  Avoid fast food or heavy meals the first week as you are more likely to get nauseated. It is expected for your digestive tract to need a few months to get back to normal.  It is common for your bowel movements and stools to be irregular.  You will have occasional bloating and cramping that should eventually fade away.  Until you are eating solid food normally, off all pain medications, and back to regular activities; your bowels will not be normal. Focus on eating a low-fat, high fiber diet the rest of your life  (See Getting to Good Bowel Health, below).  CARE of your INCISION It is good for closed incision and even open wounds to be washed every day.  Shower every day.  Short baths are fine.  Wash the incisions and wounds clean with soap & water.    If you have a closed incision(s), wash the incision with soap & water every day.  REMOVE DRESSINGS on post op day # 3 = Tuesday 3/5. You may leave closed incisions open to air if it is dry.   You may cover the incision with clean gauze & replace it after your daily shower for comfort.   ACTIVITIES as tolerated Start light daily activities --- self-care, walking, climbing stairs-- beginning the day after surgery.  Gradually increase activities as tolerated.  Control your pain to be active.  Stop when you are tired.  Ideally, walk several times a day, eventually an hour a day.   Most people are back to most day-to-day activities in a few weeks.  It takes 4-8 weeks to get back to unrestricted, intense activity. If you can walk 30 minutes without difficulty, it is safe to try more intense activity such as jogging, treadmill, bicycling, low-impact aerobics, swimming, etc. Save the most intensive and strenuous activity for last (Usually 4-8 weeks after surgery) such as sit-ups, heavy lifting, contact sports, etc.  Refrain from any intense heavy lifting or straining until you are off narcotics for pain control.  You will have off days, but things should improve week-by-week. DO NOT PUSH THROUGH PAIN.  Let pain be your guide: If  it hurts to do something, don't do it.  Pain is your body warning you to avoid that activity for another week until the pain goes down. You may drive when you are no longer taking narcotic prescription pain medication, you can comfortably wear a seatbelt, and you can safely make sudden turns/stops to protect yourself without hesitating due to pain. You may have sexual intercourse when it is comfortable. If it hurts to do something,  stop.  MEDICATIONS Take your usually prescribed home medications unless otherwise directed.   Blood thinners:  Usually you can restart any strong blood thinners after the second postoperative day.  It is OK to take aspirin right away.     If you are on strong blood thinners (warfarin/Coumadin, Plavix, Xerelto, Eliquis, Pradaxa, etc), discuss with your surgeon, medicine PCP, and/or cardiologist for instructions on when to restart the blood thinner & if blood monitoring is needed (PT/INR blood check, etc).     PAIN CONTROL Pain after surgery or related to activity is often due to strain/injury to muscle, tendon, nerves and/or incisions.  This pain is usually short-term and will improve in a few months.  To help speed the process of healing and to get back to regular activity more quickly, DO THE FOLLOWING THINGS TOGETHER: 1. Increase activity gradually.  DO NOT PUSH THROUGH PAIN 2. Use Ice and/or Heat 3. Try Gentle Massage and/or Stretching 4. Take over the counter pain medication 5. Take Narcotic prescription pain medication for more severe pain  Good pain control = faster recovery.  It is better to take more medicine to be more active than to stay in bed all day to avoid medications. 1.  Increase activity gradually Avoid heavy lifting at first, then increase to lifting as tolerated over the next 6 weeks. Do not push through the pain.  Listen to your body and avoid positions and maneuvers than reproduce the pain.  Wait a few days before trying something more intense Walking an hour a day is encouraged to help your body recover faster and more safely.  Start slowly and stop when getting sore.  If you can walk 30 minutes without stopping or pain, you can try more intense activity (running, jogging, aerobics, cycling, swimming, treadmill, sex, sports, weightlifting, etc.) Remember: If it hurts to do it, then dont do it! 2. Use Ice and/or Heat You will have swelling and bruising around the  incisions.  This will take several weeks to resolve. Ice packs or heating pads (6-8 times a day, 30-60 minutes at a time) will help sooth soreness & bruising. Some people prefer to use ice alone, heat alone, or alternate between ice & heat.  Experiment and see what works best for you.  Consider trying ice for the first few days to help decrease swelling and bruising; then, switch to heat to help relax sore spots and speed recovery. Shower every day.  Short baths are fine.  It feels good!  Keep the incisions and wounds clean with soap & water.   3. Try Gentle Massage and/or Stretching Massage at the area of pain many times a day Stop if you feel pain - do not overdo it 4. Take over the counter pain medication This helps the muscle and nerve tissues become less irritable and calm down faster Choose ONE of the following over-the-counter anti-inflammatory medications: Acetaminophen 500mg  tabs (Tylenol) 1-2 pills with every meal and just before bedtime (avoid if you have liver problems or if you have acetaminophen in you narcotic  prescription) Naproxen 220mg  tabs (ex. Aleve, Naprosyn) 1-2 pills twice a day (avoid if you have kidney, stomach, IBD, or bleeding problems) Ibuprofen 200mg  tabs (ex. Advil, Motrin) 3-4 pills with every meal and just before bedtime (avoid if you have kidney, stomach, IBD, or bleeding problems) Take with food/snack several times a day as directed for at least 2 weeks to help keep pain / soreness down & more manageable. 5. Take Narcotic prescription pain medication for more severe pain A prescription for strong pain control is often given to you upon discharge (for example: oxycodone/Percocet, hydrocodone/Norco/Vicodin, or tramadol/Ultram) Take your pain medication as prescribed. Be mindful that most narcotic prescriptions contain Tylenol (acetaminophen) as well - avoid taking too much Tylenol. If you are having problems/concerns with the prescription medicine (does not control  pain, nausea, vomiting, rash, itching, etc.), please call us [phone] to see if we need to switch you to a different pain medicine that will work better for you and/or control your side effects better. If you need a refill on your pain medication, you must call the office before 4 pm and on weekdays only.  By federal law, prescriptions for narcotics cannot be called into a pharmacy.  They must be filled out on paper & picked up from our office by the patient or authorized caretaker.  Prescriptions cannot be filled after 4 pm nor on weekends.    WHEN TO CALL us [phone] Severe uncontrolled or worsening pain  Fever over 101 F (38.5 C) Concerns with the incision: Worsening pain, redness, rash/hives, swelling, bleeding, or drainage Reactions / problems with new medications (itching, rash, hives, nausea, etc.) Nausea and/or vomiting Difficulty urinating Difficulty breathing Worsening fatigue, dizziness, lightheadedness, blurred vision Other concerns If you are not getting better after two weeks or are noticing you are getting worse, contact our office [phone] for further advice.  We may need to adjust your medications, re-evaluate you in the office, send you to the emergency room, or see what other things we can do to help. The clinic staff is available to answer your questions during regular business hours (8:30am-5pm).  Please dont hesitate to call and ask to speak to one of our nurses for clinical concerns.    A surgeon from [facility] is always on call at the hospitals 24 hours/day If you have a medical emergency, go to the nearest emergency room or call 911.  FOLLOW UP in our office One the day of your discharge from the hospital (or the next business weekday), please call [facility] to set up or confirm an appointment to see your surgeon in the office for a follow-up appointment.  Usually it is 2-3 weeks after your surgery.   If you have skin  staples at your incision(s), let the office know so we can set up a time in the office for the nurse to remove them (usually around 10 days after surgery). Make sure that you call for appointments the day of discharge (or the next business weekday) from the hospital to ensure a convenient appointment time. IF YOU HAVE DISABILITY OR FAMILY LEAVE FORMS, BRING THEM TO THE OFFICE FOR PROCESSING.  DO NOT GIVE THEM TO YOUR DOCTOR.  [facility] [address], [place], [place]  [address] ? [phone] - Main [phone] - Toll Free,  [phone] - Fax [email or web address]  GETTING TO GOOD BOWEL HEALTH. It is expected for your digestive tract to need a few months to  get back to normal.  It is common for your bowel movements and stools to be irregular.  You will have occasional bloating and cramping that should eventually fade away.  Until you are eating solid food normally, off all pain medications, and back to regular activities; your bowels will not be normal.   Avoiding constipation The goal: ONE SOFT BOWEL MOVEMENT A DAY!    Drink plenty of fluids.  Choose water first. TAKE A FIBER SUPPLEMENT EVERY DAY THE REST OF YOUR LIFE During your first week back home, gradually add back a fiber supplement every day Experiment which form you can tolerate.   There are many forms such as powders, tablets, wafers, gummies, etc Psyllium bran (Metamucil), methylcellulose (Citrucel), Miralax or Glycolax, Benefiber, Flax Seed.  Adjust the dose week-by-week (1/2 dose/day to 6 doses a day) until you are moving your bowels 1-2 times a day.  Cut back the dose or try a different fiber product if it is giving you problems such as diarrhea or bloating. Sometimes a laxative is needed to help jump-start bowels if constipated until the fiber supplement can help regulate your bowels.  If you are tolerating eating & you are farting, it is okay to try a gentle laxative such as  double dose MiraLax, prune juice, or Milk of Magnesia.  Avoid using laxatives too often. Stool softeners can sometimes help counteract the constipating effects of narcotic pain medicines.  It can also cause diarrhea, so avoid using for too long. If you are still constipated despite taking fiber daily, eating solids, and a few doses of laxatives, call our office. Controlling diarrhea Try drinking liquids and eating bland foods for a few days to avoid stressing your intestines further. Avoid dairy products (especially milk & ice cream) for a short time.  The intestines often can lose the ability to digest lactose when stressed. Avoid foods that cause gassiness or bloating.  Typical foods include beans and other legumes, cabbage, broccoli, and dairy foods.  Avoid greasy, spicy, fast foods.  Every person has some sensitivity to other foods, so listen to your body and avoid those foods that trigger problems for you. Probiotics (such as active yogurt, Align, etc) may help repopulate the intestines and colon with normal bacteria and calm down a sensitive digestive tract Adding a fiber supplement gradually can help thicken stools by absorbing excess fluid and retrain the intestines to act more normally.  Slowly increase the dose over a few weeks.  Too much fiber too soon can backfire and cause cramping & bloating. It is okay to try and slow down diarrhea with a few doses of antidiarrheal medicines.   Bismuth subsalicylate (ex. Kayopectate, Pepto Bismol) for a few doses can help control diarrhea.  Avoid if pregnant.   Loperamide (Imodium) can slow down diarrhea.  Start with one tablet (2mg ) first.  Avoid if you are having fevers or severe pain.  ILEOSTOMY PATIENTS WILL HAVE CHRONIC DIARRHEA since their colon is not in use.    Drink plenty of liquids.  You will need to drink even more glasses of water/liquid a day to avoid getting dehydrated. Record output from your ileostomy.  Expect to empty the bag every 3-4  hours at first.  Most people with a permanent ileostomy empty their bag 4-6 times at the least.   Use antidiarrheal medicine (especially Imodium) several times a day to avoid getting dehydrated.  Start with a dose at bedtime & breakfast.  Adjust up or down as needed.  Increase  antidiarrheal medications as directed to avoid emptying the bag more than 8 times a day (every 3 hours). Work with your wound ostomy nurse to learn care for your ostomy.  See ostomy care instructions. TROUBLESHOOTING IRREGULAR BOWELS 1) Start with a soft & bland diet. No spicy, greasy, or fried foods.  2) Avoid gluten/wheat or dairy products from diet to see if symptoms improve. 3) Miralax 17gm or flax seed mixed in 8oz. water or juice-daily. May use 2-4 times a day as needed. 4) Gas-X, Phazyme, etc. as needed for gas & bloating.  5) Prilosec (omeprazole) over-the-counter as needed 6)  Consider probiotics (Align, Activa, etc) to help calm the bowels down  Call your doctor if you are getting worse or not getting better.  Sometimes further testing (cultures, endoscopy, X-ray studies, CT scans, bloodwork, etc.) may be needed to help diagnose and treat the cause of the diarrhea. [facility] [address], [address], [place], [place]  [address] [phone] - Main.    [phone]  - Toll Free.   [phone] - Fax [email or web address]    Appendicitis The appendix is a finger-shaped tube that is attached to the large intestine. Appendicitis is inflammation of the appendix. Without treatment, appendicitis can cause the appendix to tear (rupture). A ruptured appendix can lead to a life-threatening infection. It can also lead to the formation of a painful collection of pus (abscess) in the appendix. What are the causes? This condition may be caused by a blockage in the appendix that leads to infection. The blockage can be due to:  A ball of stool.  Enlarged lymph glands.  In some  cases, the cause may not be known. What increases the risk? This condition is more likely to develop in people who are 23-19 years of age. What are the signs or symptoms? Symptoms of this condition include:  Pain around the belly button that moves toward the lower right abdomen. The pain can become more severe as time passes. It gets worse with coughing or sudden movements.  Tenderness in the lower right abdomen.  Nausea.  Vomiting.  Loss of appetite.  Fever.  Constipation.  Diarrhea.  Generally not feeling well.  How is this diagnosed? This condition may be diagnosed with:  A physical exam.  Blood tests.  Urine test.  To confirm the diagnosis, an ultrasound, MRI, or CT scan may be done. How is this treated? This condition is usually treated by taking out the appendix (appendectomy). There are two methods for doing an appendectomy:  Open appendectomy. In this surgery, the appendix is removed through a large cut (incision) that is made in the lower right abdomen. This procedure may be recommended if: ? You have major scarring from a previous surgery. ? You have a bleeding disorder. ? You are pregnant and are near term. ? You have a condition that makes the laparoscopic procedure impossible, such as an advanced infection or a ruptured appendix.  Laparoscopic appendectomy. In this surgery, the appendix is removed through small incisions. This procedure usually causes less pain and fewer problems than an open appendectomy. It also has a shorter recovery time.  If the appendix has ruptured and an abscess has formed, a drain may be placed into the abscess to remove fluid and antibiotic medicines may be given through an IV tube. The appendix may or may not need to be removed. This information is not intended to replace advice given to you by your health care provider. Make sure  you discuss any questions you have with your health care provider. Document Released: 01/28/2005  Document Revised: 06/07/2015 Document Reviewed: 06/15/2014 [employer] Interactive Patient Education  2017 [employer]

## 2017-04-13 NOTE — Discharge Summary (Signed)
Physician Discharge Summary  Patient ID: Adrienne [name] MRN: [id number] DOB/AGE: 10-12-1960  57 y.o.  Admit date: 04/11/2017 Discharge date: 04/13/2017   Patient Care Team: Christain Sacramento, MD as PCP - General (Family Medicine) Michael Boston, MD as Consulting Physician (General Surgery)  Discharge Diagnoses:  Principal Problem:   Perforated & gangrenous appendicitis s/p lap appendectomy 04/12/2017 Active Problems:   Anxiety   Essential hypertension   Tobacco abuse   Depression   1 Day Post-Op  04/12/2017  POST-OPERATIVE DIAGNOSIS:   Perforated Appendicitis  SURGERY:  04/12/2017  Procedure(s): APPENDECTOMY LAPAROSCOPIC  SURGEON:    Surgeon(s): Michael Boston, MD  Consults: None  Hospital Course:   Patient with low-grade fevers and worsening abdominal pain.  Found to have evidence of appendicitis.  Underwent laparoscopic appendectomy.  It was perforated with some purulence.  Postoperatively, the patient gradually mobilized and advanced to a solid diet.  Pain and other symptoms were treated aggressively.    By the time of discharge, the patient was walking well the hallways, eating food, having flatus.  Pain was well-controlled on an oral medications.  Based on meeting discharge criteria and continuing to recover, I felt it was safe for the patient to be discharged from the hospital to further recover with close followup.  Augmentin antibiotics for 5 more days.  Return to work in 2 weeks given the severity of her appendicitis.  Follow-up in the office in 2 weeks postoperative recommendations were discussed in detail.  They are written as well.  Discharged Condition: good  Disposition:  [facility], [place]. Schedule an appointment as soon as possible for a visit in 3 week(s).   Specialty:  General Surgery Why:  To follow up after your operation, To follow up after your hospital stay Contact information: [address] [place] [phone]          01-Home or Self Care  Discharge Instructions    Call MD for:   Complete by:  As directed    FEVER > 101.5 F  (temperatures < 101.5 F are not significant)   Call MD for:   Complete by:  As directed    FEVER > 101.5 F  (temperatures < 101.5 F are not significant)   Call MD for:  extreme fatigue   Complete by:  As directed    Call MD for:  extreme fatigue   Complete by:  As directed    Call MD for:  persistant dizziness or light-headedness   Complete by:  As directed    Call MD for:  persistant dizziness or light-headedness   Complete by:  As directed    Call MD for:  persistant nausea and vomiting   Complete by:  As directed    Call MD for:  persistant nausea and vomiting   Complete by:  As directed    Call MD for:  redness, tenderness, or signs of infection (pain, swelling, redness, odor or green/yellow discharge around incision site)   Complete by:  As directed    Call MD for:  redness, tenderness, or signs of infection (pain, swelling, redness, odor or green/yellow discharge around incision site)   Complete by:  As directed    Call MD for:  severe uncontrolled pain   Complete by:  As directed    Call MD for:  severe uncontrolled pain   Complete by:  As directed    Diet - low sodium heart  healthy   Complete by:  As directed    Start with a bland diet such as soups, liquids, starchy foods, low fat foods, etc. the first few days at home. Gradually advance to a solid, low-fat, high fiber diet by the end of the first week at home.   Add a fiber supplement to your diet (Metamucil, etc) If you feel full, bloated, or constipated, stay on a full liquid or pureed/blenderized diet for a few days until you feel better and are no longer constipated.   Diet - low sodium heart healthy   Complete by:  As directed    Start with a bland diet such as soups, liquids, starchy foods, low fat foods, etc. the first few days at  home. Gradually advance to a solid, low-fat, high fiber diet by the end of the first week at home.   Add a fiber supplement to your diet (Metamucil, etc) If you feel full, bloated, or constipated, stay on a full liquid or pureed/blenderized diet for a few days until you feel better and are no longer constipated.   Discharge instructions   Complete by:  As directed    See Discharge Instructions If you are not getting better after two weeks or are noticing you are getting worse, contact our office [phone] for further advice.  We may need to adjust your medications, re-evaluate you in the office, send you to the emergency room, or see what other things we can do to help. The clinic staff is available to answer your questions during regular business hours (8:30am-5pm).  Please don't hesitate to call and ask to speak to one of our nurses for clinical concerns.    A surgeon from [facility] is always on call at the hospitals 24 hours/day If you have a medical emergency, go to the nearest emergency room or call 911.   Discharge instructions   Complete by:  As directed    See Discharge Instructions If you are not getting better after two weeks or are noticing you are getting worse, contact our office [phone] for further advice.  We may need to adjust your medications, re-evaluate you in the office, send you to the emergency room, or see what other things we can do to help. The clinic staff is available to answer your questions during regular business hours (8:30am-5pm).  Please don't hesitate to call and ask to speak to one of our nurses for clinical concerns.    A surgeon from [facility] is always on call at the hospitals 24 hours/day If you have a medical emergency, go to the nearest emergency room or call 911.   Discharge wound care:   Complete by:  As directed    It is good for closed incision and even open wounds to be washed every day.  Shower every day.   Short baths are fine.  Wash the incisions and wounds clean with soap & water.    If you have a closed incision(s), wash the incision with soap & water every day.  You may leave closed incisions open to air if it is dry.   You may cover the incision with clean gauze & replace it after your daily shower for comfort. If you have skin tapes (Steristrips) or skin glue (Dermabond) on your incision, leave them in place.  They will fall off on their own like a scab.  You may trim any edges that curl up with clean scissors.  If you  have staples, set up an appointment for them to be removed in the office in 10 days after surgery.  If you have a drain, wash around the skin exit site with soap & water and place a new dressing of gauze or band aid around the skin every day.  Keep the drain site clean & dry.   Discharge wound care:   Complete by:  As directed    It is good for closed incision and even open wounds to be washed every day.  Shower every day.  Short baths are fine.  Wash the incisions and wounds clean with soap & water.     REMOVE DRESSINGS ON Tuesday 3/5  If you have a closed incision(s), wash the incision with soap & water every day.  You may leave closed incisions open to air if it is dry.   You may cover the incision with clean gauze & replace it after your daily shower for comfort.   Driving Restrictions   Complete by:  As directed    You may drive when: - you are no longer taking narcotic prescription pain medication - you can comfortably wear a seatbelt - you can safely make sudden turns/stops without pain.   Driving Restrictions   Complete by:  As directed    You may drive when: - you are no longer taking narcotic prescription pain medication - you can comfortably wear a seatbelt - you can safely make sudden turns/stops without pain.   Increase activity slowly   Complete by:  As directed    Start light daily activities --- self-care, walking, climbing stairs- beginning the day after  surgery.  Gradually increase activities as tolerated.  Control your pain to be active.  Stop when you are tired.  Ideally, walk several times a day, eventually an hour a day.   Most people are back to most day-to-day activities in a few weeks.  It takes 4-6 weeks to get back to unrestricted, intense activity. If you can walk 30 minutes without difficulty, it is safe to try more intense activity such as jogging, treadmill, bicycling, low-impact aerobics, swimming, etc. Save the most intensive and strenuous activity for last (Usually 4-8 weeks after surgery) such as sit-ups, heavy lifting, contact sports, etc.  Refrain from any intense heavy lifting or straining until you are off narcotics for pain control.  You will have off days, but things should improve week-by-week. DO NOT PUSH THROUGH PAIN.  Let pain be your guide: If it hurts to do something, don't do it.   Increase activity slowly   Complete by:  As directed    Start light daily activities --- self-care, walking, climbing stairs- beginning the day after surgery.  Gradually increase activities as tolerated.  Control your pain to be active.  Stop when you are tired.  Ideally, walk several times a day, eventually an hour a day.   Most people are back to most day-to-day activities in a few weeks.  It takes 4-6 weeks to get back to unrestricted, intense activity. If you can walk 30 minutes without difficulty, it is safe to try more intense activity such as jogging, treadmill, bicycling, low-impact aerobics, swimming, etc. Save the most intensive and strenuous activity for last (Usually 4-8 weeks after surgery) such as sit-ups, heavy lifting, contact sports, etc.  Refrain from any intense heavy lifting or straining until you are off narcotics for pain control.  You will have off days, but things should improve week-by-week. DO NOT PUSH THROUGH  PAIN.  Let pain be your guide: If it hurts to do something, don't do it.   Lifting restrictions   Complete by:   As directed    If you can walk 30 minutes without difficulty, it is safe to try more intense activity such as jogging, treadmill, bicycling, low-impact aerobics, swimming, etc. Save the most intensive and strenuous activity for last (Usually 4-8 weeks after surgery) such as sit-ups, heavy lifting, contact sports, etc.   Refrain from any intense heavy lifting or straining until you are off narcotics for pain control.  You will have off days, but things should improve week-by-week. DO NOT PUSH THROUGH PAIN.  Let pain be your guide: If it hurts to do something, don't do it.  Pain is your body warning you to avoid that activity for another week until the pain goes down.   Lifting restrictions   Complete by:  As directed    If you can walk 30 minutes without difficulty, it is safe to try more intense activity such as jogging, treadmill, bicycling, low-impact aerobics, swimming, etc. Save the most intensive and strenuous activity for last (Usually 4-8 weeks after surgery) such as sit-ups, heavy lifting, contact sports, etc.   Refrain from any intense heavy lifting or straining until you are off narcotics for pain control.  You will have off days, but things should improve week-by-week. DO NOT PUSH THROUGH PAIN.  Let pain be your guide: If it hurts to do something, don't do it.  Pain is your body warning you to avoid that activity for another week until the pain goes down.   May shower / Bathe   Complete by:  As directed    May shower / Bathe   Complete by:  As directed    May walk up steps   Complete by:  As directed    May walk up steps   Complete by:  As directed    Sexual Activity Restrictions   Complete by:  As directed    You may have sexual intercourse when it is comfortable. If it hurts to do something, stop.   Sexual Activity Restrictions   Complete by:  As directed    You may have sexual intercourse when it is comfortable. If it hurts to do something, stop.      Allergies as of  04/13/2017      Reactions   Morphine And Related Itching      Medication List    TAKE these medications   ALPRAZolam 0.25 MG tablet Commonly known as:  XANAX Take 0.25-0.5 mg by mouth 3 (three) times daily as needed for anxiety. For anxiety.   amoxicillin-clavulanate 875-125 MG tablet Commonly known as:  AUGMENTIN Take 1 tablet by mouth 2 (two) times daily.   carvedilol 12.5 MG tablet Commonly known as:  COREG Take 12.5 mg by mouth 2 (two) times daily.   hydrochlorothiazide 12.5 MG capsule Commonly known as:  MICROZIDE Take 12.5 mg by mouth daily.   hydrocortisone-pramoxine 2.5-1 % rectal cream Commonly known as:  ANALPRAM-HC Place rectally 3 (three) times daily.   ibuprofen 200 MG tablet Commonly known as:  ADVIL,MOTRIN Take 400 mg by mouth every 8 (eight) hours as needed for headache, mild pain or moderate pain. For pain.   multivitamin with minerals Tabs tablet Take 1 tablet by mouth daily.   oxyCODONE 5 MG immediate release tablet Commonly known as:  Oxy IR/ROXICODONE Take 1-2 tablets (5-10 mg total) by mouth every 6 (six) hours as  needed for moderate pain, severe pain or breakthrough pain.   venlafaxine XR 75 MG 24 hr capsule Commonly known as:  EFFEXOR-XR Take 150 mg by mouth daily.            Discharge Care Instructions  (From admission, onward)        Start     Ordered   04/13/17 0000  Discharge wound care:    Comments:  It is good for closed incision and even open wounds to be washed every day.  Shower every day.  Short baths are fine.  Wash the incisions and wounds clean with soap & water.     REMOVE DRESSINGS ON Tuesday 3/5  If you have a closed incision(s), wash the incision with soap & water every day.  You may leave closed incisions open to air if it is dry.   You may cover the incision with clean gauze & replace it after your daily shower for comfort.   04/13/17 9628   04/12/17 0000  Discharge wound care:    Comments:  It is good for closed  incision and even open wounds to be washed every day.  Shower every day.  Short baths are fine.  Wash the incisions and wounds clean with soap & water.    If you have a closed incision(s), wash the incision with soap & water every day.  You may leave closed incisions open to air if it is dry.   You may cover the incision with clean gauze & replace it after your daily shower for comfort. If you have skin tapes (Steristrips) or skin glue (Dermabond) on your incision, leave them in place.  They will fall off on their own like a scab.  You may trim any edges that curl up with clean scissors.  If you have staples, set up an appointment for them to be removed in the office in 10 days after surgery.  If you have a drain, wash around the skin exit site with soap & water and place a new dressing of gauze or band aid around the skin every day.  Keep the drain site clean & dry.   04/12/17 0921      Significant Diagnostic Studies:  Results for orders placed or performed during the hospital encounter of 04/11/17 (from the past 72 hour(s))  Lipase, blood     Status: None   Collection Time: 04/11/17  9:23 PM  Result Value Ref Range   Lipase 27 11 - 51 U/L    Comment: Performed at [facility], [place] [address] [place], [place] [address]  Comprehensive metabolic panel     Status: Abnormal   Collection Time: 04/11/17  9:23 PM  Result Value Ref Range   Sodium 137 135 - 145 mmol/L   Potassium 3.6 3.5 - 5.1 mmol/L   Chloride 99 (L) 101 - 111 mmol/L   CO2 26 22 - 32 mmol/L   Glucose, Bld 113 (H) 65 - 99 mg/dL   BUN 13 6 - 20 mg/dL   Creatinine, Ser 0.98 0.44 - 1.00 mg/dL   Calcium 9.6 8.9 - 10.3 mg/dL   Total Protein 7.6 6.5 - 8.1 g/dL   Albumin 4.4 3.5 - 5.0 g/dL   AST 19 15 - 41 U/L   ALT 16 14 - 54 U/L   Alkaline Phosphatase 66 38 - 126 U/L   Total Bilirubin 0.6 0.3 - 1.2 mg/dL   GFR calc non Af Amer >60 >60 mL/min  GFR calc Af Amer >60 >60 mL/min    Comment: (NOTE) The eGFR has  been calculated using the CKD EPI equation. This calculation has not been validated in all clinical situations. eGFR's persistently <60 mL/min signify possible Chronic Kidney Disease.    Anion gap 12 5 - 15    Comment: Performed at [facility], [place] [address], [place], [place] [address]  CBC     Status: Abnormal   Collection Time: 04/11/17  9:23 PM  Result Value Ref Range   WBC 12.1 (H) 4.0 - 10.5 K/uL   RBC 4.24 3.87 - 5.11 MIL/uL   Hemoglobin 15.0 12.0 - 15.0 g/dL   HCT 43.2 36.0 - 46.0 %   MCV 101.9 (H) 78.0 - 100.0 fL   MCH 35.4 (H) 26.0 - 34.0 pg   MCHC 34.7 30.0 - 36.0 g/dL   RDW 12.2 11.5 - 15.5 %   Platelets 296 150 - 400 K/uL    Comment: Performed at [facility] [place] [address] [place], [place] [address]  I-Stat beta hCG blood, ED     Status: Abnormal   Collection Time: 04/11/17  9:35 PM  Result Value Ref Range   I-stat hCG, quantitative 7.2 (H) <5 mIU/mL   Comment 3            Comment:   GEST. AGE      CONC.  (mIU/mL)   <=1 WEEK        5 - 50     2 WEEKS       50 - 500     3 WEEKS       100 - 10,000     4 WEEKS     1,000 - 30,000        FEMALE AND NON-PREGNANT FEMALE:     LESS THAN 5 mIU/mL   Urinalysis, Routine w reflex microscopic     Status: Abnormal   Collection Time: 04/11/17 10:13 PM  Result Value Ref Range   Color, Urine YELLOW YELLOW   APPearance CLEAR CLEAR   Specific Gravity, Urine 1.011 1.005 - 1.030   pH 7.0 5.0 - 8.0   Glucose, UA NEGATIVE NEGATIVE mg/dL   Hgb urine dipstick SMALL (A) NEGATIVE   Bilirubin Urine NEGATIVE NEGATIVE   Ketones, ur NEGATIVE NEGATIVE mg/dL   Protein, ur NEGATIVE NEGATIVE mg/dL   Nitrite NEGATIVE NEGATIVE   Leukocytes, UA NEGATIVE NEGATIVE   RBC / HPF 0-5 0 - 5 RBC/hpf   WBC, UA 0-5 0 - 5 WBC/hpf   Bacteria, UA RARE (A) NONE SEEN   Squamous Epithelial / LPF 0-5 (A) NONE SEEN   Mucus PRESENT    Hyaline Casts, UA PRESENT     Comment: Performed at [facility],  [place] [address] [place], [place] [address]  HIV antibody (Routine Testing)     Status: None   Collection Time: 04/12/17  4:36 AM  Result Value Ref Range   HIV Screen 4th Generation wRfx Non Reactive Non Reactive    Comment: (NOTE) Performed At: [facility] [place], [place] [id number] Rush Farmer MD [id number] Performed at [facility], [place] [address], [place], [place] [address]   Surgical pcr screen     Status: None   Collection Time: 04/12/17  6:01 AM  Result Value Ref Range   MRSA, PCR NEGATIVE NEGATIVE   Staphylococcus aureus NEGATIVE NEGATIVE    Comment: (NOTE) The Xpert SA Assay (FDA approved for  NASAL specimens in patients 65 years of age and older), is one component of a comprehensive surveillance program. It is not intended to diagnose infection nor to guide or monitor treatment. Performed at [facility], [place] [address], [place], [place] [address]     Ct Abdomen Pelvis W Contrast  Result Date: 04/11/2017 CLINICAL DATA:  Lower abdominal pain EXAM: CT ABDOMEN AND PELVIS WITH CONTRAST TECHNIQUE: Multidetector CT imaging of the abdomen and pelvis was performed using the standard protocol following bolus administration of intravenous contrast. CONTRAST:  174m ISOVUE-300 IOPAMIDOL (ISOVUE-300) INJECTION 61%, <See Chart> ISOVUE-300 IOPAMIDOL (ISOVUE-300) INJECTION 61% COMPARISON:  None. FINDINGS: Lower chest: Lung bases demonstrate no acute consolidation or effusion. Normal heart size. Hepatobiliary: No focal liver abnormality is seen. No gallstones, gallbladder wall thickening, or biliary dilatation. Pancreas: Unremarkable. No pancreatic ductal dilatation or surrounding inflammatory changes. Spleen: Normal in size without focal abnormality. Adrenals/Urinary Tract: Right adrenal gland is normal. 2.3 cm indeterminate left adrenal mass. Exophytic 12 mm hypodensity upper pole left kidney, slight increased density values.  No hydronephrosis. The bladder is normal Stomach/Bowel: The stomach is nonenlarged. Thickened appearance of right lower quadrant distal ileal loops with mucosal enhancement. Appendix slightly enlarged, measuring up to 9 mm in size. Considerable inflammation around the appendix with the tip terminating in an inflammatory soft tissue density that is contiguous with the right adnexa, series 2, image number 61. No extraluminal gas. No stone. Vascular/Lymphatic: Moderate aortic atherosclerosis. No aneurysmal dilatation. No significantly enlarged lymph nodes. Reproductive: Uterus unremarkable. Inflammatory changes in the right adnexa with 19 mm inflammatory right adnexal mass. Other: Negative for free air or free fluid. Musculoskeletal: Trace anterolisthesis of L4 on L5. No acute or suspicious lesion. IMPRESSION: 1. Right lower quadrant inflammatory process. Appendix is slightly enlarged up to 9 mm in size with enhancement present. The tip of the appendix appears contiguous with a 19 mm inflammatory mass centered in the right adnexa, adjacent to the right ovary. Differential considerations include appendicitis with tip perforation and adjacent phlegmon. Given close proximity to the adnexa/right ovary, could also consider PID with secondary inflammation of the appendix or other cause of inflammatory adnexal mass such as endometriosis. No free air. Appendix: Location: Right lower quadrant Diameter: 9 mm Appendicolith: Not seen Mucosal hyper-enhancement: Present Extraluminal gas: Not seen Periappendiceal collection: No significant rim enhancing abscess. Possible phlegmon adjacent to the tip of the appendix. 1. Thickened right lower quadrant distal ileal loops are likely reactive 2. Indeterminate 2.3 cm left adrenal mass. Recommend further evaluation with nonemergent adrenal CT. 3. 12 mm intermediate density arising from the upper pole of left kidney. Could be further evaluated with multiphasic CT or MRI, nonemergently.  Electronically Signed   By: KDonavan FoilM.D.   On: 04/11/2017 23:23    Discharge Exam: Blood pressure 122/77, pulse 72, temperature (!) 97.5 F (36.4 C), temperature source Oral, resp. rate 18, height '5\' 6"'  (1.676 m), weight 77.6 kg (171 lb), last menstrual period 04/11/2012, SpO2 96 %.  General: Pt awake/alert/oriented x4 in No acute distress Eyes: PERRL, normal EOM.  Sclera clear.  No icterus Neuro: CN II-XII intact w/o focal sensory/motor deficits. Lymph: No head/neck/groin lymphadenopathy Psych:  No delerium/psychosis/paranoia HENT: Normocephalic, Mucus membranes moist.  No thrush Neck: Supple, No tracheal deviation Chest: No chest wall pain w good excursion CV:  Pulses intact.  Regular rhythm MS: Normal AROM mjr joints.  No obvious deformity Abdomen: Soft.  Nondistended.  Mildly tender at incisions only.  No evidence of peritonitis.  No incarcerated hernias. Ext:  SCDs BLE.  No mjr edema.  No cyanosis Skin: No petechiae / purpura  Past Medical History:  Diagnosis Date  . Anxiety    EFFEXOR-DR. FRED WILSON  . Cervical dysplasia   . Elevated cholesterol   . Endometriosis   . Migraine with aura 12/2006   COULD NOT SPEAK, pt has aura without migraines  . Ovarian cyst     Past Surgical History:  Procedure Laterality Date  . ABDOMINAL SURGERY  2002   EXPLORATORY LAPAROTOMY, LSO , RIGHT OV CYSTECTOMY  . BRONCHIAL CLEFT CYST    . COLPOSCOPY    . GYNECOLOGIC CRYOSURGERY    . LAPAROSCOPIC APPENDECTOMY N/A 04/12/2017   Procedure: APPENDECTOMY LAPAROSCOPIC;  Surgeon: Michael Boston, MD;  Location: [facility];  Service: General;  Laterality: N/A;  . OOPHORECTOMY  2002   LSO, R OVARIAN CYSTECTOMY, EXPLOR LAPAROT  . PELVIC LAPAROSCOPY    . TYMPANOSTOMY TUBE PLACEMENT      Social History   Socioeconomic History  . Marital status: Married    Spouse name: Not on file  . Number of children: Not on file  . Years of education: Not on file  . Highest education level: Not on file   Social Needs  . Financial resource strain: Not on file  . Food insecurity - worry: Not on file  . Food insecurity - inability: Not on file  . Transportation needs - medical: Not on file  . Transportation needs - non-medical: Not on file  Occupational History  . Not on file  Tobacco Use  . Smoking status: Current Every Day Smoker    Packs/day: 0.50    Types: Cigarettes  . Smokeless tobacco: Never Used  Substance and Sexual Activity  . Alcohol use: Yes    Alcohol/week: 2.5 oz    Types: 5 Standard drinks or equivalent per week  . Drug use: No  . Sexual activity: Yes    Birth control/protection: None, Post-menopausal    Comment: neg des, intercourse age 53, more than 5 sexual partners  Other Topics Concern  . Not on file  Social History Narrative  . Not on file    Family History  Adopted: Yes    Current Facility-Administered Medications  Medication Dose Route Frequency Provider Last Rate Last Dose  . 0.9 %  sodium chloride infusion  250 mL Intravenous PRN Michael Boston, MD      . acetaminophen (TYLENOL) tablet 1,000 mg  1,000 mg Oral TID Michael Boston, MD   1,000 mg at 04/12/17 2123  . albuterol (PROVENTIL) (2.5 MG/3ML) 0.083% nebulizer solution 2.5 mg  2.5 mg Nebulization Q4H PRN Michael Boston, MD      . ALPRAZolam Duanne Moron) tablet 0.25-0.5 mg  0.25-0.5 mg Oral TID PRN Michael Boston, MD      . alum & mag hydroxide-simeth (MAALOX/MYLANTA) 200-200-20 MG/5ML suspension 30 mL  30 mL Oral Q6H PRN Michael Boston, MD      . amoxicillin-clavulanate (AUGMENTIN) 875-125 MG per tablet 1 tablet  1 tablet Oral Gorden Harms, MD      . bisacodyl (DULCOLAX) suppository 10 mg  10 mg Rectal Q12H PRN Michael Boston, MD      . carvedilol (COREG) tablet 12.5 mg  12.5 mg Oral BID Michael Boston, MD   12.5 mg at 04/13/17 0813  . diphenhydrAMINE (BENADRYL) 12.5 MG/5ML elixir 12.5 mg  12.5 mg Oral Q6H PRN Michael Boston, MD       Or  . diphenhydrAMINE (BENADRYL) injection 12.5  mg  12.5 mg  Intravenous Q6H PRN Michael Boston, MD      . enoxaparin (LOVENOX) injection 40 mg  40 mg Subcutaneous Ardeen Fillers, MD      . gabapentin (NEURONTIN) capsule 300 mg  300 mg Oral BID Michael Boston, MD   300 mg at 04/12/17 2123  . guaiFENesin-dextromethorphan (ROBITUSSIN DM) 100-10 MG/5ML syrup 10 mL  10 mL Oral Q4H PRN Michael Boston, MD      . hydrocortisone (ANUSOL-HC) 2.5 % rectal cream 1 application  1 application Topical QID PRN Michael Boston, MD      . hydrocortisone cream 1 % 1 application  1 application Topical TID PRN Michael Boston, MD      . hydrocortisone-pramoxine [facility]) 2.5-1 % rectal cream   Rectal TID Michael Boston, MD      . HYDROmorphone (DILAUDID) injection 0.5-2 mg  0.5-2 mg Intravenous Q2H PRN Michael Boston, MD   1 mg at 04/12/17 0058  . ibuprofen (ADVIL,MOTRIN) tablet 400 mg  400 mg Oral Q8H PRN Michael Boston, MD   400 mg at 04/13/17 0820  . lactated ringers bolus 1,000 mL  1,000 mL Intravenous Q8H PRN Michael Boston, MD      . lip balm (CARMEX) ointment 1 application  1 application Topical BID Michael Boston, MD   1 application at 34/91/79 2126  . magic mouthwash  15 mL Oral QID PRN Michael Boston, MD      . menthol-cetylpyridinium (CEPACOL) lozenge 3 mg  1 lozenge Oral PRN Michael Boston, MD      . methocarbamol (ROBAXIN) 1,000 mg in dextrose 5 % 50 mL IVPB  1,000 mg Intravenous Q6H PRN Michael Boston, MD      . methocarbamol (ROBAXIN) tablet 1,000 mg  1,000 mg Oral Q6H PRN Michael Boston, MD      . metoCLOPramide (REGLAN) injection 10 mg  10 mg Intravenous Q6H PRN Michael Boston, MD      . multivitamin with minerals tablet 1 tablet  1 tablet Oral Daily Michael Boston, MD   1 tablet at 04/12/17 1209  . nicotine (NICODERM CQ - dosed in mg/24 hours) patch 14 mg  14 mg Transdermal Daily Michael Boston, MD      . ondansetron (ZOFRAN-ODT) disintegrating tablet 4 mg  4 mg Oral Q6H PRN Michael Boston, MD       Or  . ondansetron (ZOFRAN) injection 4 mg  4 mg Intravenous Q6H PRN  Michael Boston, MD   4 mg at 04/12/17 0058  . oxyCODONE (Oxy IR/ROXICODONE) immediate release tablet 5-10 mg  5-10 mg Oral Q4H PRN Michael Boston, MD      . phenol (CHLORASEPTIC) mouth spray 1-2 spray  1-2 spray Mouth/Throat PRN Michael Boston, MD      . prochlorperazine (COMPAZINE) injection 5-10 mg  5-10 mg Intravenous Q4H PRN Michael Boston, MD      . psyllium (HYDROCIL/METAMUCIL) packet 1 packet  1 packet Oral Daily Michael Boston, MD   1 packet at 04/12/17 1225  . simethicone (MYLICON) chewable tablet 40 mg  40 mg Oral Q6H PRN Michael Boston, MD      . sodium chloride flush (NS) 0.9 % injection 3 mL  3 mL Intravenous Gorden Harms, MD      . sodium chloride flush (NS) 0.9 % injection 3 mL  3 mL Intravenous PRN Michael Boston, MD      . venlafaxine XR (EFFEXOR-XR) 24 hr capsule 150 mg  150 mg Oral Daily Michael Boston, MD  150 mg at 04/12/17 1207     Allergies  Allergen Reactions  . Morphine And Related Itching    Signed: Morton Peters, M.D., F.A.C.S. Gastrointestinal and Minimally Invasive Surgery [facility] [address] [place], [place] [address] [phone] Main / Paging   04/13/2017, 9:42 AM

## 2017-04-19 ENCOUNTER — Ambulatory Visit (HOSPITAL_COMMUNITY)
Admission: EM | Admit: 2017-04-19 | Discharge: 2017-04-19 | Disposition: A | Discharge status: Nursing Home (Includes ICF) | Payer: BC Managed Care – PPO | Source: Home / Self Care

## 2017-04-19 ENCOUNTER — Encounter (HOSPITAL_COMMUNITY): Payer: Self-pay | Admitting: Emergency Medicine

## 2017-04-19 ENCOUNTER — Emergency Department (HOSPITAL_COMMUNITY): Payer: BC Managed Care – PPO

## 2017-04-19 ENCOUNTER — Inpatient Hospital Stay (HOSPITAL_COMMUNITY)
Admission: EM | Admit: 2017-04-19 | Discharge: 2017-04-24 | DRG: 814 | Disposition: A | Discharge status: Home / Self Care | Payer: BC Managed Care – PPO | Attending: General Surgery | Admitting: General Surgery

## 2017-04-19 DIAGNOSIS — E861 Hypovolemia: Secondary | ICD-10-CM | POA: Diagnosis present

## 2017-04-19 DIAGNOSIS — I10 Essential (primary) hypertension: Secondary | ICD-10-CM | POA: Diagnosis present

## 2017-04-19 DIAGNOSIS — F419 Anxiety disorder, unspecified: Secondary | ICD-10-CM | POA: Diagnosis present

## 2017-04-19 DIAGNOSIS — Z8719 Personal history of other diseases of the digestive system: Secondary | ICD-10-CM | POA: Diagnosis not present

## 2017-04-19 DIAGNOSIS — F329 Major depressive disorder, single episode, unspecified: Secondary | ICD-10-CM | POA: Diagnosis present

## 2017-04-19 DIAGNOSIS — Z885 Allergy status to narcotic agent status: Secondary | ICD-10-CM | POA: Diagnosis not present

## 2017-04-19 DIAGNOSIS — K661 Hemoperitoneum: Secondary | ICD-10-CM | POA: Diagnosis present

## 2017-04-19 DIAGNOSIS — I9589 Other hypotension: Secondary | ICD-10-CM | POA: Diagnosis present

## 2017-04-19 DIAGNOSIS — Z79899 Other long term (current) drug therapy: Secondary | ICD-10-CM

## 2017-04-19 DIAGNOSIS — R109 Unspecified abdominal pain: Secondary | ICD-10-CM

## 2017-04-19 DIAGNOSIS — R1012 Left upper quadrant pain: Secondary | ICD-10-CM | POA: Diagnosis present

## 2017-04-19 DIAGNOSIS — D735 Infarction of spleen: Principal | ICD-10-CM | POA: Diagnosis present

## 2017-04-19 DIAGNOSIS — D7832 Postprocedural hematoma of the spleen following other procedure: Secondary | ICD-10-CM | POA: Diagnosis present

## 2017-04-19 DIAGNOSIS — S3609XA Other injury of spleen, initial encounter: Secondary | ICD-10-CM

## 2017-04-19 DIAGNOSIS — D62 Acute posthemorrhagic anemia: Secondary | ICD-10-CM | POA: Diagnosis present

## 2017-04-19 DIAGNOSIS — Z9049 Acquired absence of other specified parts of digestive tract: Secondary | ICD-10-CM

## 2017-04-19 DIAGNOSIS — F17210 Nicotine dependence, cigarettes, uncomplicated: Secondary | ICD-10-CM | POA: Diagnosis present

## 2017-04-19 LAB — CBC WITH DIFFERENTIAL/PLATELET
BASOS ABS: 0 10*3/uL (ref 0.0–0.1)
BASOS PCT: 0 %
EOS PCT: 1 %
Eosinophils Absolute: 0.1 10*3/uL (ref 0.0–0.7)
HCT: 30 % — ABNORMAL LOW (ref 36.0–46.0)
Hemoglobin: 10 g/dL — ABNORMAL LOW (ref 12.0–15.0)
Lymphocytes Relative: 22 %
Lymphs Abs: 2.7 10*3/uL (ref 0.7–4.0)
MCH: 34.7 pg — ABNORMAL HIGH (ref 26.0–34.0)
MCHC: 33.3 g/dL (ref 30.0–36.0)
MCV: 104.2 fL — ABNORMAL HIGH (ref 78.0–100.0)
Monocytes Absolute: 1 10*3/uL (ref 0.1–1.0)
Monocytes Relative: 8 %
Neutro Abs: 8.4 10*3/uL — ABNORMAL HIGH (ref 1.7–7.7)
Neutrophils Relative %: 69 %
PLATELETS: 469 10*3/uL — AB (ref 150–400)
RBC: 2.88 MIL/uL — ABNORMAL LOW (ref 3.87–5.11)
RDW: 12.4 % (ref 11.5–15.5)
WBC: 12.2 10*3/uL — ABNORMAL HIGH (ref 4.0–10.5)

## 2017-04-19 LAB — COMPREHENSIVE METABOLIC PANEL
ALBUMIN: 3.4 g/dL — AB (ref 3.5–5.0)
ALK PHOS: 95 U/L (ref 38–126)
ALT: 38 U/L (ref 14–54)
ANION GAP: 10 (ref 5–15)
AST: 32 U/L (ref 15–41)
BILIRUBIN TOTAL: 0.5 mg/dL (ref 0.3–1.2)
BUN: 22 mg/dL — AB (ref 6–20)
CALCIUM: 9.2 mg/dL (ref 8.9–10.3)
CO2: 25 mmol/L (ref 22–32)
Chloride: 103 mmol/L (ref 101–111)
Creatinine, Ser: 1.4 mg/dL — ABNORMAL HIGH (ref 0.44–1.00)
GFR calc Af Amer: 48 mL/min — ABNORMAL LOW (ref >60)
GFR, EST NON AFRICAN AMERICAN: 41 mL/min — AB (ref >60)
GLUCOSE: 136 mg/dL — AB (ref 65–99)
Potassium: 4.1 mmol/L (ref 3.5–5.1)
Sodium: 138 mmol/L (ref 135–145)
TOTAL PROTEIN: 6.9 g/dL (ref 6.5–8.1)

## 2017-04-19 LAB — URINALYSIS, ROUTINE W REFLEX MICROSCOPIC
Bilirubin Urine: NEGATIVE
GLUCOSE, UA: NEGATIVE mg/dL
Ketones, ur: NEGATIVE mg/dL
LEUKOCYTES UA: NEGATIVE
NITRITE: NEGATIVE
Protein, ur: NEGATIVE mg/dL
SPECIFIC GRAVITY, URINE: 1.014 (ref 1.005–1.030)
pH: 5 (ref 5.0–8.0)

## 2017-04-19 LAB — I-STAT CG4 LACTIC ACID, ED
LACTIC ACID, VENOUS: 1.12 mmol/L (ref 0.5–1.9)
Lactic Acid, Venous: 1.93 mmol/L — ABNORMAL HIGH (ref 0.5–1.9)

## 2017-04-19 LAB — LIPASE, BLOOD: LIPASE: 21 U/L (ref 11–51)

## 2017-04-19 LAB — POC OCCULT BLOOD, ED: FECAL OCCULT BLD: NEGATIVE

## 2017-04-19 LAB — I-STAT TROPONIN, ED: Troponin i, poc: 0 ng/mL (ref 0.00–0.08)

## 2017-04-19 MED ORDER — PANTOPRAZOLE SODIUM 40 MG IV SOLR
40.0000 mg | Freq: Every day | INTRAVENOUS | Status: DC
  Administered 2017-04-20 – 2017-04-22 (×4): 40 mg via INTRAVENOUS
  Filled 2017-04-19 (×4): qty 40

## 2017-04-19 MED ORDER — FENTANYL CITRATE (PF) 100 MCG/2ML IJ SOLN
50.0000 ug | Freq: Once | INTRAMUSCULAR | Status: AC
  Administered 2017-04-19: 50 ug via INTRAVENOUS
  Filled 2017-04-19: qty 2

## 2017-04-19 MED ORDER — ONDANSETRON HCL 4 MG/2ML IJ SOLN: 4.0000 mg | Freq: Four times a day (QID) | INTRAMUSCULAR | Status: DC | PRN

## 2017-04-19 MED ORDER — SODIUM CHLORIDE 0.9 % IV SOLN
Freq: Once | INTRAVENOUS | Status: AC
  Administered 2017-04-19: 20:00:00 via INTRAVENOUS

## 2017-04-19 MED ORDER — METHOCARBAMOL 1000 MG/10ML IJ SOLN
500.0000 mg | Freq: Four times a day (QID) | INTRAVENOUS | Status: DC | PRN
  Administered 2017-04-20 – 2017-04-22 (×6): 500 mg via INTRAVENOUS
  Filled 2017-04-19: qty 550
  Filled 2017-04-19: qty 5
  Filled 2017-04-19 (×5): qty 550

## 2017-04-19 MED ORDER — SODIUM CHLORIDE 0.9 % IV SOLN
Freq: Once | INTRAVENOUS | Status: AC
  Administered 2017-04-19: via INTRAVENOUS

## 2017-04-19 MED ORDER — ONDANSETRON 4 MG PO TBDP: 4.0000 mg | ORAL_TABLET | Freq: Four times a day (QID) | ORAL | Status: DC | PRN

## 2017-04-19 MED ORDER — KCL IN DEXTROSE-NACL 20-5-0.9 MEQ/L-%-% IV SOLN
INTRAVENOUS | Status: DC
  Administered 2017-04-20 – 2017-04-21 (×4): via INTRAVENOUS
  Administered 2017-04-21: 100 mL/h via INTRAVENOUS
  Administered 2017-04-22 – 2017-04-23 (×3): via INTRAVENOUS
  Filled 2017-04-19 (×9): qty 1000

## 2017-04-19 MED ORDER — PIPERACILLIN-TAZOBACTAM 3.375 G IVPB 30 MIN
3.3750 g | INTRAVENOUS | Status: DC
  Filled 2017-04-19: qty 50

## 2017-04-19 MED ORDER — SODIUM CHLORIDE 0.9 % IV BOLUS (SEPSIS)
1000.0000 mL | Freq: Once | INTRAVENOUS | Status: AC
  Administered 2017-04-19: 1000 mL via INTRAVENOUS

## 2017-04-19 MED ORDER — HYDROMORPHONE HCL 1 MG/ML IJ SOLN
0.5000 mg | INTRAMUSCULAR | Status: DC | PRN
  Administered 2017-04-19 – 2017-04-21 (×19): 1 mg via INTRAVENOUS
  Filled 2017-04-19 (×19): qty 1

## 2017-04-19 MED ORDER — SODIUM CHLORIDE 0.9 % IV BOLUS (SEPSIS)
250.0000 mL | Freq: Once | INTRAVENOUS | Status: AC
  Administered 2017-04-19: 250 mL via INTRAVENOUS

## 2017-04-19 NOTE — ED Triage Notes (Signed)
Pt had appendectomy last Saturday and today started having left side pain that is worse with movement and taking deep breaths. Patient is taking her Oxycotin and not helping.

## 2017-04-19 NOTE — H&P (Signed)
Adrienne [name] is an 57 y.o. female.   Chief Complaint: abd pain HPI: The patient is a 57 year old white female who presents with the acute onset of left upper quadrant abdominal pain earlier today.  The pain is more diffuse now.  She denies any fevers or chills.  She is one-week status post laparoscopic appendectomy.  She was doing very well until this morning when the pain started.  She denies any trauma.  She came to the emergency department where a CT scan shows a splenic hematoma with a small amount of blood in the abdomen.  Her hemoglobin is 10.  Her systolic blood pressure is around 100 with a heart rate of 90  Past Medical History:  Diagnosis Date  . Anxiety    EFFEXOR-DR. FRED WILSON  . Cervical dysplasia   . Elevated cholesterol   . Endometriosis   . Migraine with aura 12/2006   COULD NOT SPEAK, pt has aura without migraines  . Ovarian cyst     Past Surgical History:  Procedure Laterality Date  . ABDOMINAL SURGERY  2002   EXPLORATORY LAPAROTOMY, LSO , RIGHT OV CYSTECTOMY  . BRONCHIAL CLEFT CYST    . COLPOSCOPY    . GYNECOLOGIC CRYOSURGERY    . LAPAROSCOPIC APPENDECTOMY N/A 04/12/2017   Procedure: APPENDECTOMY LAPAROSCOPIC;  Surgeon: Michael Boston, MD;  Location: [facility];  Service: General;  Laterality: N/A;  . OOPHORECTOMY  2002   LSO, R OVARIAN CYSTECTOMY, EXPLOR LAPAROT  . PELVIC LAPAROSCOPY    . TYMPANOSTOMY TUBE PLACEMENT      Family History  Adopted: Yes   Social History:  reports that she has been smoking cigarettes.  She has been smoking about 0.50 packs per day. she has never used smokeless tobacco. She reports that she drinks about 2.5 oz of alcohol per week. She reports that she does not use drugs.  Allergies:  Allergies  Allergen Reactions  . Morphine And Related Itching     (Not in a hospital admission)  Results for orders placed or performed during the hospital encounter of 04/19/17 (from the past 48 hour(s))  Comprehensive metabolic panel      Status: Abnormal   Collection Time: 04/19/17  6:19 PM  Result Value Ref Range   Sodium 138 135 - 145 mmol/L   Potassium 4.1 3.5 - 5.1 mmol/L   Chloride 103 101 - 111 mmol/L   CO2 25 22 - 32 mmol/L   Glucose, Bld 136 (H) 65 - 99 mg/dL   BUN 22 (H) 6 - 20 mg/dL   Creatinine, Ser 1.40 (H) 0.44 - 1.00 mg/dL   Calcium 9.2 8.9 - 10.3 mg/dL   Total Protein 6.9 6.5 - 8.1 g/dL   Albumin 3.4 (L) 3.5 - 5.0 g/dL   AST 32 15 - 41 U/L   ALT 38 14 - 54 U/L   Alkaline Phosphatase 95 38 - 126 U/L   Total Bilirubin 0.5 0.3 - 1.2 mg/dL   GFR calc non Af Amer 41 (L) >60 mL/min   GFR calc Af Amer 48 (L) >60 mL/min    Comment: (NOTE) The eGFR has been calculated using the CKD EPI equation. This calculation has not been validated in all clinical situations. eGFR's persistently <60 mL/min signify possible Chronic Kidney Disease.    Anion gap 10 5 - 15    Comment: Performed at [facility], [place] [address], [place], [place] [address]  CBC WITH DIFFERENTIAL     Status: Abnormal   Collection  Time: 04/19/17  6:19 PM  Result Value Ref Range   WBC 12.2 (H) 4.0 - 10.5 K/uL   RBC 2.88 (L) 3.87 - 5.11 MIL/uL   Hemoglobin 10.0 (L) 12.0 - 15.0 g/dL   HCT 30.0 (L) 36.0 - 46.0 %   MCV 104.2 (H) 78.0 - 100.0 fL   MCH 34.7 (H) 26.0 - 34.0 pg   MCHC 33.3 30.0 - 36.0 g/dL   RDW 12.4 11.5 - 15.5 %   Platelets 469 (H) 150 - 400 K/uL   Neutrophils Relative % 69 %   Neutro Abs 8.4 (H) 1.7 - 7.7 K/uL   Lymphocytes Relative 22 %   Lymphs Abs 2.7 0.7 - 4.0 K/uL   Monocytes Relative 8 %   Monocytes Absolute 1.0 0.1 - 1.0 K/uL   Eosinophils Relative 1 %   Eosinophils Absolute 0.1 0.0 - 0.7 K/uL   Basophils Relative 0 %   Basophils Absolute 0.0 0.0 - 0.1 K/uL    Comment: Performed at [facility], [place] [address] [place], [place] [address]  Lipase, blood     Status: None   Collection Time: 04/19/17  6:19 PM  Result Value Ref Range   Lipase 21 11 - 51 U/L    Comment: Performed  at [facility], [place] [address], [place], [place] [address]  Type and screen [place]     Status: None   Collection Time: 04/19/17  6:21 PM  Result Value Ref Range   ABO/RH(D) B POS    Antibody Screen NEG    Sample Expiration      04/22/2017 Performed at [facility], [place] [address], [place], [place] [address]   POC occult blood, ED Provider will collect     Status: None   Collection Time: 04/19/17  6:26 PM  Result Value Ref Range   Fecal Occult Bld NEGATIVE NEGATIVE  I-stat troponin, ED (not at [facility])     Status: None   Collection Time: 04/19/17  6:42 PM  Result Value Ref Range   Troponin i, poc 0.00 0.00 - 0.08 ng/mL   Comment 3            Comment: Due to the release kinetics of cTnI, a negative result within the first hours of the onset of symptoms does not rule out myocardial infarction with certainty. If myocardial infarction is still suspected, repeat the test at appropriate intervals.   I-Stat CG4 Lactic Acid, ED  (not at  [facility])     Status: Abnormal   Collection Time: 04/19/17  6:44 PM  Result Value Ref Range   Lactic Acid, Venous 1.93 (H) 0.5 - 1.9 mmol/L   Ct Abdomen Pelvis Wo Contrast  Result Date: 04/19/2017 CLINICAL DATA:  Patient had an appendectomy last week. Left-sided pain beginning this morning. EXAM: CT ABDOMEN AND PELVIS WITHOUT CONTRAST TECHNIQUE: Multidetector CT imaging of the abdomen and pelvis was performed following the standard protocol without IV contrast. COMPARISON:  04/11/2017 FINDINGS: Lower chest: Minor dependent subsegmental atelectasis. Otherwise clear. Heart normal size. Hepatobiliary: No focal liver abnormality is seen. No gallstones, gallbladder wall thickening, or biliary dilatation. Pancreas: Unremarkable. No pancreatic ductal dilatation or surrounding inflammatory changes. Spleen: There is a perisplenic hematoma which mostly obscures the spleen. The hematoma displaces the adjacent  bowel. It has average Hounsfield units of 46. Adrenals/Urinary Tract: 2 cm left adrenal mass, mixed attenuation, average Hounsfield units of 16, stable from the prior CT. Normal right adrenal gland.  Stable small upper pole left renal cyst. No other renal masses, no stones and no hydronephrosis. Normal ureters. Bladder is decompressed. Stomach/Bowel: Surgical anastomosis staples along the posterior margin of the cecum from the recent appendectomy. There is no adjacent extraluminal air and no defined fluid collection to suggest an abscess. Stomach is unremarkable. Small bowel is normal caliber. No bowel wall thickening or adjacent inflammation. Colon is unremarkable. Vascular/Lymphatic: Mildly prominent gastrohepatic ligament lymph nodes, none pathologically enlarged, all stable. Aortic atherosclerotic calcifications, also stable from the recent CT. Reproductive: Uterus and bilateral adnexa are unremarkable. Other: Addition to the perisplenic hematoma, there is a small amount of free peritoneal fluid, with Hounsfield units higher than simple fluid, consistent with hemoperitoneum. No abdominal wall hernia. Musculoskeletal: No fracture or acute finding. No osteoblastic or osteolytic lesions. IMPRESSION: 1. There is a perisplenic hematoma consistent with a splenic injury. The injury itself is not defined on this exam, which is limited by lack of intravenous contrast. In addition to the perisplenic hematoma, there is a small amount of hemoperitoneum. 2. No extraluminal air or focal fluid collection in the right lower quadrant to suggest an abscess or anastomotic leak. 3. Left adrenal mass, indeterminate, and left renal cyst, both stable from the recent prior CT. 4. Aortic atherosclerosis. Electronically Signed   By: Lajean Manes M.D.   On: 04/19/2017 19:17    Review of Systems  Constitutional: Negative.   HENT: Negative.   Eyes: Negative.   Respiratory: Negative.   Cardiovascular: Negative.   Gastrointestinal:  Positive for abdominal pain.  Genitourinary: Negative.   Musculoskeletal: Negative.   Skin: Negative.   Neurological: Negative.   Endo/Heme/Allergies: Negative.   Psychiatric/Behavioral: Negative.     Blood pressure 103/70, pulse 93, temperature (!) 97.5 F (36.4 C), temperature source Oral, resp. rate (!) 29, height '5\' 6"'  (1.676 m), weight 77.6 kg (171 lb), last menstrual period 03/29/2013, SpO2 94 %. Physical Exam  Constitutional: She is oriented to person, place, and time. She appears well-developed and well-nourished. She appears distressed.  HENT:  Head: Normocephalic and atraumatic.  Mouth/Throat: No oropharyngeal exudate.  Eyes: Conjunctivae and EOM are normal. Pupils are equal, round, and reactive to light.  Neck: Normal range of motion. Neck supple.  Cardiovascular: Normal rate, regular rhythm and normal heart sounds.  Respiratory: Breath sounds normal. No stridor. She is in respiratory distress.  GI:  The abdomen is very tender especially LUQ  Musculoskeletal: Normal range of motion. She exhibits no edema or tenderness.  Neurological: She is alert and oriented to person, place, and time. Coordination normal.  Skin: Skin is warm and dry. No rash noted.  Psychiatric: She has a normal mood and affect. Her behavior is normal. Thought content normal.     Assessment/Plan The patient appears to have developed an acute splenic hematoma the cause of which is unknown.  At this point I will plan to admit her to the ICU for bedrest and serial hemoglobins.  There is certainly the possibility that this may tamponade itself and not require surgical intervention.  If she continues to drop her hemoglobin require transfusion then she may require a more urgent splenectomy.  I have discussed these options with her and she understands and agrees with the treatment plan.  Merrie Roof, MD 04/19/2017, 9:10 PM

## 2017-04-19 NOTE — Progress Notes (Signed)
A consult was received from an ED physician for Zosyn per pharmacy dosing.  The patient's profile has been reviewed for ht/wt/allergies/indication/available labs.   A one time order has been placed for Zosyn 3.375gm IV.  Further antibiotics/pharmacy consults should be ordered by admitting physician if indicated.                       Thank you, Maryellen PilePoindexter, Calianna Kim Trefz, PharmD 04/19/2017  7:22 PM

## 2017-04-19 NOTE — ED Notes (Signed)
ED TO INPATIENT HANDOFF REPORT  Name/Age/Gender Adrienne [name] 57 y.o. 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 Anxiety    EFFEXOR-DR. FRED WILSON  . Cervical dysplasia   . Elevated cholesterol   . Endometriosis   . Migraine with aura 12/2006   COULD NOT SPEAK, pt has aura without migraines  . Ovarian cyst     Allergies Allergies  Allergen Reactions  . Morphine And Related Itching    IV Location/Drains/Wounds Patient Lines/Drains/Airways Status   Active Line/Drains/Airways    Name:   Placement date:   Placement time:   Site:   Days:   Peripheral IV 04/19/17 Left Antecubital   04/19/17    1850    Antecubital   less than 1   Peripheral IV 04/19/17 Left Hand   04/19/17    1850    Hand   less than 1   Incision (Closed) 04/12/17 Abdomen Other (Comment)   04/12/17    0838     7   Incision - 3 Ports Abdomen 1: Right;Lower 2: Umbilicus 3: Left;Lower   04/12/17    0831     7          Labs/Imaging Results for orders placed or performed during the hospital encounter of 04/19/17 (from the past 48 hour(s))  Comprehensive metabolic panel      Status: Abnormal   Collection Time: 04/19/17  6:19 PM  Result Value Ref Range   Sodium 138 135 - 145 mmol/L   Potassium 4.1 3.5 - 5.1 mmol/L   Chloride 103 101 - 111 mmol/L   CO2 25 22 - 32 mmol/L   Glucose, Bld 136 (H) 65 - 99 mg/dL   BUN 22 (H) 6 - 20 mg/dL   Creatinine, Ser 1.40 (H) 0.44 - 1.00 mg/dL   Calcium 9.2 8.9 - 10.3 mg/dL   Total Protein 6.9 6.5 - 8.1 g/dL   Albumin 3.4 (L) 3.5 - 5.0 g/dL   AST 32 15 - 41 U/L   ALT 38 14 - 54 U/L   Alkaline Phosphatase 95 38 - 126 U/L   Total Bilirubin 0.5 0.3 - 1.2 mg/dL   GFR calc non Af Amer 41 (L) >60 mL/min   GFR calc Af Amer 48 (L) >60 mL/min    Comment: (NOTE) The eGFR has been calculated using the CKD EPI equation. This calculation has not been validated in all clinical situations. eGFR's persistently <60 mL/min signify possible Chronic Kidney Disease.    Anion gap 10 5 - 15    Comment: Performed at [facility], [place] [address] [place], [place] [address]  CBC WITH DIFFERENTIAL     Status: Abnormal   Collection Time: 04/19/17  6:19 PM  Result Value Ref Range   WBC 12.2 (H) 4.0 - 10.5 K/uL   RBC 2.88 (L) 3.87 - 5.11 MIL/uL   Hemoglobin 10.0 (L) 12.0 - 15.0 g/dL   HCT 30.0 (L) 36.0 - 46.0 %   MCV 104.2 (H) 78.0 - 100.0 fL   MCH 34.7 (H) 26.0 - 34.0 pg   MCHC 33.3 30.0 - 36.0 g/dL   RDW 12.4 11.5 - 15.5 %   Platelets 469 (H) 150 - 400 K/uL   Neutrophils Relative % 69 %   Neutro Abs 8.4 (H) 1.7 - 7.7 K/uL   Lymphocytes Relative 22 %   Lymphs Abs 2.7 0.7 - 4.0 K/uL   Monocytes Relative 8 %   Monocytes Absolute 1.0 0.1 - 1.0 K/uL   Eosinophils Relative 1 %   Eosinophils Absolute 0.1 0.0 - 0.7 K/uL   Basophils Relative 0 %   Basophils Absolute 0.0 0.0 - 0.1 K/uL    Comment: Performed at [facility], [place] [address] [place], [place] [address]  Urinalysis, Routine w reflex microscopic     Status: Abnormal   Collection Time: 04/19/17  6:19 PM  Result Value Ref Range   Color, Urine  YELLOW YELLOW   APPearance HAZY (A) CLEAR   Specific Gravity, Urine 1.014 1.005 - 1.030   pH 5.0 5.0 - 8.0   Glucose, UA NEGATIVE NEGATIVE mg/dL   Hgb urine dipstick SMALL (A) NEGATIVE   Bilirubin Urine NEGATIVE NEGATIVE   Ketones, ur NEGATIVE NEGATIVE mg/dL   Protein, ur NEGATIVE NEGATIVE mg/dL   Nitrite NEGATIVE NEGATIVE   Leukocytes, UA NEGATIVE NEGATIVE   RBC / HPF 0-5 0 - 5 RBC/hpf   WBC, UA 0-5 0 - 5 WBC/hpf   Bacteria, UA RARE (A) NONE SEEN   Squamous Epithelial / LPF 0-5 (A) NONE SEEN   Mucus PRESENT    Hyaline Casts, UA PRESENT     Comment: Performed at [facility], [place] [address] [place], [place] [address]  Lipase, blood     Status: None   Collection Time: 04/19/17  6:19 PM  Result Value Ref Range   Lipase 21 11 - 51 U/L    Comment: Performed at [facility], [place] [address], [place], [place] [address]  Type and screen [place]     Status: None   Collection Time: 04/19/17  6:21 PM  Result Value Ref Range   ABO/RH(D) B POS    Antibody Screen NEG    Sample Expiration      04/22/2017 Performed at [facility], [place] [address] [place], [place] [address]   POC occult blood, ED Provider will collect     Status: None   Collection Time: 04/19/17  6:26 PM  Result Value Ref Range   Fecal Occult Bld NEGATIVE NEGATIVE  I-stat troponin, ED (not at [facility])     Status: None   Collection Time: 04/19/17  6:42 PM  Result Value Ref Range   Troponin i, poc 0.00 0.00 - 0.08 ng/mL   Comment 3            Comment: Due to the release kinetics of cTnI, a negative result within the first hours of the onset of symptoms does not rule out myocardial infarction with certainty. If myocardial infarction is still suspected, repeat the test at  appropriate intervals.   I-Stat CG4 Lactic Acid, ED  (not at  [facility])     Status: Abnormal   Collection Time: 04/19/17  6:44 PM  Result Value Ref Range   Lactic Acid,  Venous 1.93 (H) 0.5 - 1.9 mmol/L  I-Stat CG4 Lactic Acid, ED  (not at  [facility])     Status: None   Collection Time: 04/19/17  9:27 PM  Result Value Ref Range   Lactic Acid, Venous 1.12 0.5 - 1.9 mmol/L   Ct Abdomen Pelvis Wo Contrast  Result Date: 04/19/2017 CLINICAL DATA:  Patient had an appendectomy last week. Left-sided pain beginning this morning. EXAM: CT ABDOMEN AND PELVIS WITHOUT CONTRAST TECHNIQUE: Multidetector CT imaging of the abdomen and pelvis was performed following the standard protocol without IV contrast. COMPARISON:  04/11/2017 FINDINGS: Lower chest: Minor dependent subsegmental atelectasis. Otherwise clear. Heart normal size. Hepatobiliary: No focal liver abnormality is seen. No gallstones, gallbladder wall thickening, or biliary dilatation. Pancreas: Unremarkable. No pancreatic ductal dilatation or surrounding inflammatory changes. Spleen: There is a perisplenic hematoma which mostly obscures the spleen. The hematoma displaces the adjacent bowel. It has average Hounsfield units of 46. Adrenals/Urinary Tract: 2 cm left adrenal mass, mixed attenuation, average Hounsfield units of 16, stable from the prior CT. Normal right adrenal gland. Stable small upper pole left renal cyst. No other renal masses, no stones and no hydronephrosis. Normal ureters. Bladder is decompressed. Stomach/Bowel: Surgical anastomosis staples along the posterior margin of the cecum from the recent appendectomy. There is no adjacent extraluminal air and no defined fluid collection to suggest an abscess. Stomach is unremarkable. Small bowel is normal caliber. No bowel wall thickening or adjacent inflammation. Colon is unremarkable. Vascular/Lymphatic: Mildly prominent gastrohepatic ligament lymph nodes, none pathologically enlarged, all stable. Aortic atherosclerotic calcifications, also stable from the recent CT. Reproductive: Uterus and bilateral adnexa are unremarkable. Other: Addition to the perisplenic hematoma, there  is a small amount of free peritoneal fluid, with Hounsfield units higher than simple fluid, consistent with hemoperitoneum. No abdominal wall hernia. Musculoskeletal: No fracture or acute finding. No osteoblastic or osteolytic lesions. IMPRESSION: 1. There is a perisplenic hematoma consistent with a splenic injury. The injury itself is not defined on this exam, which is limited by lack of intravenous contrast. In addition to the perisplenic hematoma, there is a small amount of hemoperitoneum. 2. No extraluminal air or focal fluid collection in the right lower quadrant to suggest an abscess or anastomotic leak. 3. Left adrenal mass, indeterminate, and left renal cyst, both stable from the recent prior CT. 4. Aortic atherosclerosis. Electronically Signed   By: Lajean Manes M.D.   On: 04/19/2017 19:17    Pending Labs Unresulted Labs (From admission, onward)   Start     Ordered   04/19/17 1943  Prepare RBC  (Adult Blood Administration - Red Blood Cells)  Once,   R    Question Answer Comment  # of Units 2 units   Transfusion Indications Emergent / Trauma   Number of Units to Keep Ahead 2 units ahead   If emergent release call blood bank [facility] [phone]   Instructions: Transfuse      04/19/17 1942   04/19/17 1830  ABO/Rh  Once,   R     04/19/17 1830   04/19/17 1819  Blood Culture (routine x 2)  BLOOD CULTURE X 2,   STAT     04/19/17 1821   Signed and Held  Protime-INR  Once,   R  Signed and Held   Signed and Held  CBC  Now then every 4 hours,   R     Signed and Held   Signed and Occupational hygienist morning,   R     Signed and Held   Signed and Held  Type and screen [place]  STAT,   [place]    Comments:  [place]    Signed and Held      Vitals/Pain Today's Vitals   04/19/17 2028 04/19/17 2045 04/19/17 2125 04/19/17 2201  BP:  103/70 (!) 86/65   Pulse:  93 83   Resp:  (!) 29 (!) 24   Temp:      TempSrc:      SpO2:   94% 98%   Weight:      Height:      PainSc: 5    4     Isolation Precautions No active isolations  Medications Medications  sodium chloride 0.9 % bolus 1,000 mL (0 mLs Intravenous Stopped 04/19/17 1925)    And  sodium chloride 0.9 % bolus 1,000 mL (0 mLs Intravenous Stopped 04/19/17 1920)    And  sodium chloride 0.9 % bolus 250 mL (0 mLs Intravenous Stopped 04/19/17 1929)  fentaNYL (SUBLIMAZE) injection 50 mcg (50 mcg Intravenous Given 04/19/17 1942)  0.9 %  sodium chloride infusion ( Intravenous Stopped 04/19/17 2212)  fentaNYL (SUBLIMAZE) injection 50 mcg (50 mcg Intravenous Given 04/19/17 2051)    Mobility walks

## 2017-04-19 NOTE — ED Notes (Signed)
Patient transported to CT 

## 2017-04-19 NOTE — ED Notes (Signed)
ED Provider at bedside. 

## 2017-04-19 NOTE — ED Provider Notes (Signed)
[facility]  CSN: [id number] Arrival date & time: 04/19/17 1753  Chief Complaint(s) Abdominal Pain  HPI Adrienne [name] is a [age] y.o. female who was recently admitted for a perforated gangrenous appendicitis status post resection on March 2 who presents to the emergency department with 1 day of left upper/left flank abdominal pain that is achy and sharp in nature.  Has been constant since onset and gradually worsening.  Patient reports having dark brown/blackish stools since she was discharge on Sunday (6 days ago).  Patient denies any fevers, chills, nausea, vomiting.  She denies any chest pain or shortness of breath.  Pain is exacerbated with deep breathing, certain positions, palpation of the left side of the abdomen.  Alleviated only with certain positions or not completely resolved.  No other alleviating or aggravating factors.  Patient denies any urinary symptoms.  She denies any other physical complaints.  HPI  Past Medical History Past Medical History:  Diagnosis Date  . Anxiety    EFFEXOR-DR. FRED WILSON  . Cervical dysplasia   . Elevated cholesterol   . Endometriosis   . Migraine with aura 12/2006   COULD NOT SPEAK, pt has aura without migraines  . Ovarian cyst    Patient Active Problem List   Diagnosis Date Noted  . Perforated & gangrenous appendicitis s/p lap appendectomy 04/12/2017 04/12/2017  . Tobacco abuse 04/12/2017  . Depression 09/21/2015  . Essential hypertension 07/15/2012  . Cervical dysplasia   . Ovarian cyst   . Endometriosis   . Atypical migraine   . Anxiety    Home Medication(s) Prior to Admission medications   Medication Sig Start Date End Date Taking? Authorizing Provider  ALPRAZolam (XANAX) 0.25 MG tablet Take 0.25-0.5 mg by mouth 3 (three) times daily as needed for anxiety. For anxiety.   Yes [provider]  amoxicillin-clavulanate (AUGMENTIN) 875-125 MG tablet Take 1 tablet by mouth 2  (two) times daily. 04/13/17  Yes Karie SodaGross, Steven, MD  carvedilol (COREG) 12.5 MG tablet Take 12.5 mg by mouth 2 (two) times daily.  09/19/16  Yes [provider]  hydrochlorothiazide (MICROZIDE) 12.5 MG capsule Take 12.5 mg by mouth daily.   Yes [provider]  ibuprofen (ADVIL,MOTRIN) 200 MG tablet Take 400 mg by mouth every 8 (eight) hours as needed for headache, mild pain or moderate pain. For pain.    Yes [provider]  oxyCODONE (OXY IR/ROXICODONE) 5 MG immediate release tablet Take 1-2 tablets (5-10 mg total) by mouth every 6 (six) hours as needed for moderate pain, severe pain or breakthrough pain. 04/12/17  Yes Karie SodaGross, Steven, MD  venlafaxine XR (EFFEXOR-XR) 75 MG 24 hr capsule Take 150 mg by mouth daily.    Yes [provider]  hydrocortisone-pramoxine [facility](ANALPRAM-HC) 2.5-1 % rectal cream Place rectally 3 (three) times daily. Patient not taking: Reported on 04/11/2017 07/15/12   Harrington ChallengerYoung, Nancy J, NP  Multiple Vitamin (MULTIVITAMIN WITH MINERALS) TABS Take 1 tablet by mouth daily.    [provider]  Past Surgical History Past Surgical History:  Procedure Laterality Date  . ABDOMINAL SURGERY  2002   EXPLORATORY LAPAROTOMY, LSO , RIGHT OV CYSTECTOMY  . BRONCHIAL CLEFT CYST    . COLPOSCOPY    . GYNECOLOGIC CRYOSURGERY    . LAPAROSCOPIC APPENDECTOMY N/A 04/12/2017   Procedure: APPENDECTOMY LAPAROSCOPIC;  Surgeon: Karie Soda, MD;  Location: [facility] ORS;  Service: General;  Laterality: N/A;  . OOPHORECTOMY  2002   LSO, R OVARIAN CYSTECTOMY, EXPLOR LAPAROT  . PELVIC LAPAROSCOPY    . TYMPANOSTOMY TUBE PLACEMENT     Family History Family History  Adopted: Yes    Social History Social History   Tobacco Use  . Smoking status: Current Every Day Smoker    Packs/day: 0.50    Types: Cigarettes  . Smokeless tobacco: Never Used    Substance Use Topics  . Alcohol use: Yes    Alcohol/week: 2.5 oz    Types: 5 Standard drinks or equivalent per week  . Drug use: No   Allergies Morphine and related  Review of Systems Review of Systems All other systems are reviewed and are negative for acute change except as noted in the HPI  Physical Exam Vital Signs  I have reviewed the triage vital signs BP (!) 83/59 (BP Location: Left Arm) Comment: 2nd reading 83/59, will repeat in 20 min  Pulse 88   Temp (!) 97.5 F (36.4 C) (Oral)   Resp (!) 24   LMP 03/29/2013 Comment: perimenopausal  SpO2 96%   Physical Exam  Constitutional: She is oriented to person, place, and time. She appears well-developed and well-nourished. No distress.  HENT:  Head: Normocephalic and atraumatic.  Nose: Nose normal.  Eyes: Conjunctivae and EOM are normal. Pupils are equal, round, and reactive to light. Right eye exhibits no discharge. Left eye exhibits no discharge. No scleral icterus.  Neck: Normal range of motion. Neck supple.  Cardiovascular: Normal rate and regular rhythm. Exam reveals no gallop and no friction rub.  No murmur heard. Pulmonary/Chest: Effort normal and breath sounds normal. No stridor. No respiratory distress. She has no rales.  Abdominal: Soft. She exhibits no distension. There is tenderness in the left upper quadrant. There is rebound and guarding. There is no rigidity. No hernia.    Musculoskeletal: She exhibits no edema or tenderness.  Neurological: She is alert and oriented to person, place, and time.  Skin: Skin is warm and dry. No rash noted. She is not diaphoretic. No erythema.  Psychiatric: She has a normal mood and affect.  Vitals reviewed.   ED Results and Treatments Labs (all labs ordered are listed, but only abnormal results are displayed) Labs Reviewed  COMPREHENSIVE METABOLIC PANEL - Abnormal; Notable for the following components:      Result Value   Glucose, Bld 136 (*)    BUN 22 (*)     Creatinine, Ser 1.40 (*)    Albumin 3.4 (*)    GFR calc non Af Amer 41 (*)    GFR calc Af Amer 48 (*)    All other components within normal limits  CBC WITH DIFFERENTIAL/PLATELET - Abnormal; Notable for the following components:   WBC 12.2 (*)    RBC 2.88 (*)    Hemoglobin 10.0 (*)    HCT 30.0 (*)    MCV 104.2 (*)    MCH 34.7 (*)    Platelets 469 (*)    Neutro Abs 8.4 (*)    All other components within normal limits  I-STAT CG4 LACTIC  ACID, ED - Abnormal; Notable for the following components:   Lactic Acid, Venous 1.93 (*)    All other components within normal limits  CULTURE, BLOOD (ROUTINE X 2)  CULTURE, BLOOD (ROUTINE X 2)  LIPASE, BLOOD  URINALYSIS, ROUTINE W REFLEX MICROSCOPIC  POC OCCULT BLOOD, ED  I-STAT TROPONIN, ED  I-STAT CG4 LACTIC ACID, ED  TYPE AND SCREEN  ABO/RH  PREPARE RBC (CROSSMATCH)                                                                                                                         EKG  EKG Interpretation  Date/Time:    Ventricular Rate:    PR Interval:    QRS Duration:   QT Interval:    QTC Calculation:   R Axis:     Text Interpretation:        Radiology Ct Abdomen Pelvis Wo Contrast  Result Date: 04/19/2017 CLINICAL DATA:  Patient had an appendectomy last week. Left-sided pain beginning this morning. EXAM: CT ABDOMEN AND PELVIS WITHOUT CONTRAST TECHNIQUE: Multidetector CT imaging of the abdomen and pelvis was performed following the standard protocol without IV contrast. COMPARISON:  04/11/2017 FINDINGS: Lower chest: Minor dependent subsegmental atelectasis. Otherwise clear. Heart normal size. Hepatobiliary: No focal liver abnormality is seen. No gallstones, gallbladder wall thickening, or biliary dilatation. Pancreas: Unremarkable. No pancreatic ductal dilatation or surrounding inflammatory changes. Spleen: There is a perisplenic hematoma which mostly obscures the spleen. The hematoma displaces the adjacent bowel. It has average  Hounsfield units of 46. Adrenals/Urinary Tract: 2 cm left adrenal mass, mixed attenuation, average Hounsfield units of 16, stable from the prior CT. Normal right adrenal gland. Stable small upper pole left renal cyst. No other renal masses, no stones and no hydronephrosis. Normal ureters. Bladder is decompressed. Stomach/Bowel: Surgical anastomosis staples along the posterior margin of the cecum from the recent appendectomy. There is no adjacent extraluminal air and no defined fluid collection to suggest an abscess. Stomach is unremarkable. Small bowel is normal caliber. No bowel wall thickening or adjacent inflammation. Colon is unremarkable. Vascular/Lymphatic: Mildly prominent gastrohepatic ligament lymph nodes, none pathologically enlarged, all stable. Aortic atherosclerotic calcifications, also stable from the recent CT. Reproductive: Uterus and bilateral adnexa are unremarkable. Other: Addition to the perisplenic hematoma, there is a small amount of free peritoneal fluid, with Hounsfield units higher than simple fluid, consistent with hemoperitoneum. No abdominal wall hernia. Musculoskeletal: No fracture or acute finding. No osteoblastic or osteolytic lesions. IMPRESSION: 1. There is a perisplenic hematoma consistent with a splenic injury. The injury itself is not defined on this exam, which is limited by lack of intravenous contrast. In addition to the perisplenic hematoma, there is a small amount of hemoperitoneum. 2. No extraluminal air or focal fluid collection in the right lower quadrant to suggest an abscess or anastomotic leak. 3. Left adrenal mass, indeterminate, and left renal cyst, both stable from the recent prior CT. 4. Aortic atherosclerosis. Electronically Signed   By: Onalee Hua  Ormond M.D.   On: 04/19/2017 19:17   Pertinent labs & imaging results that were available during my care of the patient were reviewed by me and considered in my medical decision making (see chart for  details).  Medications Ordered in ED Medications  sodium chloride 0.9 % bolus 1,000 mL (0 mLs Intravenous Stopped 04/19/17 1925)    And  sodium chloride 0.9 % bolus 1,000 mL (0 mLs Intravenous Stopped 04/19/17 1920)    And  sodium chloride 0.9 % bolus 250 mL (0 mLs Intravenous Stopped 04/19/17 1929)  fentaNYL (SUBLIMAZE) injection 50 mcg (50 mcg Intravenous Given 04/19/17 1942)  0.9 %  sodium chloride infusion ( Intravenous New Bag/Given 04/19/17 1950)  fentaNYL (SUBLIMAZE) injection 50 mcg (50 mcg Intravenous Given 04/19/17 2051)                                                                                                                                    Procedures Procedures CRITICAL CARE Performed by: Amadeo Garnet Ashtan Laton Total critical care time: 60 minutes Critical care time was exclusive of separately billable procedures and treating other patients. Critical care was necessary to treat or prevent imminent or life-threatening deterioration. Critical care was time spent personally by me on the following activities: development of treatment plan with patient and/or surrogate as well as nursing, discussions with consultants, evaluation of patient's response to treatment, examination of patient, obtaining history from patient or surrogate, ordering and performing treatments and interventions, ordering and review of laboratory studies, ordering and review of radiographic studies, pulse oximetry and re-evaluation of patient's condition.   (including critical care time)  Medical Decision Making / ED Course I have reviewed the nursing notes for this encounter and the patient's prior records (if available in EHR or on provided paperwork).    Workup with evidence of splenic injury with hematoma and small amount of hemoperitoneum.  Appears that this is spontaneous given the lack of recent trauma.  Since has been 1 week since surgery, this is unlikely related to surgery.  Hemoglobin with 5 g drop  since the surgery.  Patient was hypotensive with systolics in the 70s and improved with 30 cc/kg of IV fluid.  With improved blood pressures into the low 100s.  Hemoccult was negative.  Patient was transfused 2 units of IV fluid given the hypotension.   Patient is afebrile and denies any fevers this last week.  No other evidence of infectious process.  I discussed the case with Dr. Carolynne Edouard who will evaluate the patient for admission and further management.  Final Clinical Impression(s) / ED Diagnoses Final diagnoses:  Splenic rupture  Hypotension due to hypovolemia  Left flank pain      This chart was dictated using voice recognition software.  Despite best efforts to proofread,  errors can occur which can change the documentation meaning.   Nira Conn, MD 04/19/17 2056

## 2017-04-19 NOTE — ED Notes (Addendum)
Patient's O2 saturation 91% on RA. This RN placed patient on 2L O2.

## 2017-04-20 ENCOUNTER — Other Ambulatory Visit: Payer: Self-pay

## 2017-04-20 LAB — BASIC METABOLIC PANEL
ANION GAP: 6 (ref 5–15)
BUN: 14 mg/dL (ref 6–20)
CALCIUM: 8.6 mg/dL — AB (ref 8.9–10.3)
CO2: 25 mmol/L (ref 22–32)
Chloride: 109 mmol/L (ref 101–111)
Creatinine, Ser: 0.68 mg/dL (ref 0.44–1.00)
GFR calc Af Amer: >60 mL/min (ref >60)
GLUCOSE: 110 mg/dL — AB (ref 65–99)
POTASSIUM: 3.7 mmol/L (ref 3.5–5.1)
SODIUM: 140 mmol/L (ref 135–145)

## 2017-04-20 LAB — CBC
HCT: 23.3 % — ABNORMAL LOW (ref 36.0–46.0)
HCT: 30.4 % — ABNORMAL LOW (ref 36.0–46.0)
Hemoglobin: 8 g/dL — ABNORMAL LOW (ref 12.0–15.0)
Hemoglobin: 10.1 g/dL — ABNORMAL LOW (ref 12.0–15.0)
MCH: 31.9 pg (ref 26.0–34.0)
MCH: 35.9 pg — AB (ref 26.0–34.0)
MCHC: 33.2 g/dL (ref 30.0–36.0)
MCHC: 34.3 g/dL (ref 30.0–36.0)
MCV: 104.5 fL — ABNORMAL HIGH (ref 78.0–100.0)
MCV: 95.9 fL (ref 78.0–100.0)
PLATELETS: 329 10*3/uL (ref 150–400)
PLATELETS: 414 10*3/uL — AB (ref 150–400)
RBC: 2.23 MIL/uL — ABNORMAL LOW (ref 3.87–5.11)
RBC: 3.17 MIL/uL — AB (ref 3.87–5.11)
RDW: 12.7 % (ref 11.5–15.5)
RDW: 18.5 % — AB (ref 11.5–15.5)
WBC: 10.6 10*3/uL — ABNORMAL HIGH (ref 4.0–10.5)
WBC: 11.9 10*3/uL — AB (ref 4.0–10.5)

## 2017-04-20 LAB — ABO/RH: ABO/RH(D): B POS

## 2017-04-20 LAB — PROTIME-INR
INR: 1.01
PROTHROMBIN TIME: 13.2 s (ref 11.4–15.2)

## 2017-04-20 LAB — PREPARE RBC (CROSSMATCH)

## 2017-04-20 MED ORDER — VENLAFAXINE HCL ER 150 MG PO CP24
150.0000 mg | ORAL_CAPSULE | Freq: Every day | ORAL | Status: DC
  Administered 2017-04-20 – 2017-04-24 (×5): 150 mg via ORAL
  Filled 2017-04-20 (×6): qty 1

## 2017-04-20 MED ORDER — ALPRAZOLAM 0.25 MG PO TABS
0.2500 mg | ORAL_TABLET | Freq: Three times a day (TID) | ORAL | Status: DC | PRN
  Administered 2017-04-21: 0.25 mg via ORAL
  Filled 2017-04-20: qty 1

## 2017-04-20 NOTE — Progress Notes (Signed)
Subjective/Chief Complaint: Pain level much improved. Getting second unit of blood now   Objective: Vital signs in last 24 hours: Temp:  [97.4 F (36.3 C)-98.4 F (36.9 C)] 97.9 F (36.6 C) (03/10 0547) Pulse Rate:  [75-105] 79 (03/10 0600) Resp:  [16-42] 22 (03/10 0600) BP: (68-127)/(43-78) 109/76 (03/10 0600) SpO2:  [86 %-100 %] 96 % (03/10 0600) Weight:  [77.6 kg (171 lb)-83.5 kg (184 lb 1.4 oz)] 83.5 kg (184 lb 1.4 oz) (03/10 0300)    Intake/Output from previous day: 03/09 0701 - 03/10 0700 In: 2330 [I.V.:1700; Blood:630] Out: 325 [Urine:325] Intake/Output this shift: No intake/output data recorded.  General appearance: alert and cooperative Resp: clear to auscultation bilaterally and not taking deep breaths Cardio: regular rate and rhythm GI: soft, mild tenderness  Lab Results:  Recent Labs    04/19/17 1819 04/19/17 2352  WBC 12.2* 10.6*  HGB 10.0* 8.0*  HCT 30.0* 23.3*  PLT 469* 414*   BMET Recent Labs    04/19/17 1819  NA 138  K 4.1  CL 103  CO2 25  GLUCOSE 136*  BUN 22*  CREATININE 1.40*  CALCIUM 9.2   PT/INR Recent Labs    04/19/17 2352  LABPROT 13.2  INR 1.01   ABG No results for input(s): PHART, HCO3 in the last 72 hours.  Invalid input(s): PCO2, PO2  Studies/Results: Ct Abdomen Pelvis Wo Contrast  Result Date: 04/19/2017 CLINICAL DATA:  Patient had an appendectomy last week. Left-sided pain beginning this morning. EXAM: CT ABDOMEN AND PELVIS WITHOUT CONTRAST TECHNIQUE: Multidetector CT imaging of the abdomen and pelvis was performed following the standard protocol without IV contrast. COMPARISON:  04/11/2017 FINDINGS: Lower chest: Minor dependent subsegmental atelectasis. Otherwise clear. Heart normal size. Hepatobiliary: No focal liver abnormality is seen. No gallstones, gallbladder wall thickening, or biliary dilatation. Pancreas: Unremarkable. No pancreatic ductal dilatation or surrounding inflammatory changes. Spleen: There is a  perisplenic hematoma which mostly obscures the spleen. The hematoma displaces the adjacent bowel. It has average Hounsfield units of 46. Adrenals/Urinary Tract: 2 cm left adrenal mass, mixed attenuation, average Hounsfield units of 16, stable from the prior CT. Normal right adrenal gland. Stable small upper pole left renal cyst. No other renal masses, no stones and no hydronephrosis. Normal ureters. Bladder is decompressed. Stomach/Bowel: Surgical anastomosis staples along the posterior margin of the cecum from the recent appendectomy. There is no adjacent extraluminal air and no defined fluid collection to suggest an abscess. Stomach is unremarkable. Small bowel is normal caliber. No bowel wall thickening or adjacent inflammation. Colon is unremarkable. Vascular/Lymphatic: Mildly prominent gastrohepatic ligament lymph nodes, none pathologically enlarged, all stable. Aortic atherosclerotic calcifications, also stable from the recent CT. Reproductive: Uterus and bilateral adnexa are unremarkable. Other: Addition to the perisplenic hematoma, there is a small amount of free peritoneal fluid, with Hounsfield units higher than simple fluid, consistent with hemoperitoneum. No abdominal wall hernia. Musculoskeletal: No fracture or acute finding. No osteoblastic or osteolytic lesions. IMPRESSION: 1. There is a perisplenic hematoma consistent with a splenic injury. The injury itself is not defined on this exam, which is limited by lack of intravenous contrast. In addition to the perisplenic hematoma, there is a small amount of hemoperitoneum. 2. No extraluminal air or focal fluid collection in the right lower quadrant to suggest an abscess or anastomotic leak. 3. Left adrenal mass, indeterminate, and left renal cyst, both stable from the recent prior CT. 4. Aortic atherosclerosis. Electronically Signed   By: Amie Portland M.D.   On:  04/19/2017 19:17    Anti-infectives: Anti-infectives (From admission, onward)   Start      Dose/Rate Route Frequency Ordered Stop   04/19/17 1930  piperacillin-tazobactam (ZOSYN) IVPB 3.375 g  Status:  Discontinued     3.375 g 100 mL/hr over 30 Minutes Intravenous To Emergency Dept 04/19/17 1920 04/19/17 2025      Assessment/Plan: s/p * No surgery found * Continue ice chips for now  Hg 8. Will recheck after transfusion Bedrest and serial hg for spontaneous hemorrhage into spleen Continue to monitor in icu  LOS: 1 day    TOTH III,Drue Camera S 04/20/2017

## 2017-04-21 LAB — CBC
HCT: 28.2 % — ABNORMAL LOW (ref 36.0–46.0)
Hemoglobin: 9.3 g/dL — ABNORMAL LOW (ref 12.0–15.0)
MCH: 31.8 pg (ref 26.0–34.0)
MCHC: 33 g/dL (ref 30.0–36.0)
MCV: 96.6 fL (ref 78.0–100.0)
PLATELETS: 378 10*3/uL (ref 150–400)
RBC: 2.92 MIL/uL — AB (ref 3.87–5.11)
RDW: 19.2 % — AB (ref 11.5–15.5)
WBC: 10.2 10*3/uL (ref 4.0–10.5)

## 2017-04-21 MED ORDER — ALPRAZOLAM 0.25 MG PO TABS
0.2500 mg | ORAL_TABLET | Freq: Three times a day (TID) | ORAL | Status: DC | PRN
  Administered 2017-04-21 – 2017-04-22 (×2): 0.25 mg via ORAL
  Filled 2017-04-21 (×2): qty 1

## 2017-04-21 MED ORDER — HYDROMORPHONE 1 MG/ML IV SOLN
INTRAVENOUS | Status: DC
  Filled 2017-04-21: qty 25

## 2017-04-21 MED ORDER — HYDROMORPHONE HCL 1 MG/ML IJ SOLN
0.5000 mg | INTRAMUSCULAR | Status: DC | PRN
  Administered 2017-04-21 – 2017-04-22 (×3): 1 mg via INTRAVENOUS
  Administered 2017-04-22 (×2): 2 mg via INTRAVENOUS
  Filled 2017-04-21: qty 1
  Filled 2017-04-21 (×2): qty 2
  Filled 2017-04-21 (×2): qty 1

## 2017-04-21 MED ORDER — SODIUM CHLORIDE 0.9% FLUSH
9.0000 mL | INTRAVENOUS | Status: DC | PRN
  Administered 2017-04-21: 9 mL via INTRAVENOUS
  Filled 2017-04-21: qty 9

## 2017-04-21 MED ORDER — DIPHENHYDRAMINE HCL 50 MG/ML IJ SOLN: 12.5000 mg | Freq: Four times a day (QID) | INTRAMUSCULAR | Status: DC | PRN

## 2017-04-21 MED ORDER — ONDANSETRON HCL 4 MG/2ML IJ SOLN
4.0000 mg | Freq: Four times a day (QID) | INTRAMUSCULAR | Status: DC | PRN
Start: 2017-04-21 — End: 2017-04-22

## 2017-04-21 MED ORDER — DIPHENHYDRAMINE HCL 12.5 MG/5ML PO ELIX: 12.5000 mg | ORAL_SOLUTION | Freq: Four times a day (QID) | ORAL | Status: DC | PRN

## 2017-04-21 MED ORDER — HYDROMORPHONE HCL 1 MG/ML IJ SOLN: 0.5000 mg | INTRAMUSCULAR | Status: DC | PRN

## 2017-04-21 MED ORDER — NALOXONE HCL 0.4 MG/ML IJ SOLN: 0.4000 mg | INTRAMUSCULAR | Status: DC | PRN

## 2017-04-21 MED ORDER — NICOTINE 7 MG/24HR TD PT24
7.0000 mg | MEDICATED_PATCH | Freq: Every day | TRANSDERMAL | Status: DC
  Filled 2017-04-21 (×4): qty 1

## 2017-04-21 NOTE — Progress Notes (Signed)
CC:  Increased abdominal pain after appendectomy  Subjective: Still complaining of pain LUQ, hurts in that area with deep inspiration.  Moving in bed well.  Objective: Vital signs in last 24 hours: Temp:  [98.1 F (36.7 C)-99.1 F (37.3 C)] 98.7 F (37.1 C) (03/11 0300) Pulse Rate:  [77-104] 104 (03/11 0700) Resp:  [14-26] 20 (03/11 0700) BP: (96-149)/(57-88) 131/72 (03/11 0700) SpO2:  [87 %-95 %] 87 % (03/11 0700) Last BM Date: 04/19/17 PO 60 IV fluid 2600 Blood 315 Urine 1500 Afebrile, VSS CT 3/9:  There is a perisplenic hematoma consistent with a splenic injury. The injury itself is not defined on this exam, which is limited by lack of intravenous contrast. In addition to the perisplenic hematoma, there is a small amount of hemoperitoneum. No extraluminal air or focal fluid collection in the right lower quadrant to suggest an abscess or anastomotic leak.  Left adrenal mass, indeterminate, and left renal cyst, both stable from the recent prior CT. CBC Latest Ref Rng & Units 04/21/2017 04/20/2017 04/19/2017  WBC 4.0 - 10.5 K/uL 10.2 11.9(H) 10.6(H)  Hemoglobin 12.0 - 15.0 g/dL 1.6(X) 10.1(L) 8.0(L)  Hematocrit 36.0 - 46.0 % 28.2(L) 30.4(L) 23.3(L)  Platelets 150 - 400 K/uL 378 329 414(H)     Intake/Output from previous day: 03/10 0701 - 03/11 0700 In: 3040 [P.O.:60; I.V.:2500; Blood:315; IV Piggyback:165] Out: 1500 [Urine:1500] Intake/Output this shift: No intake/output data recorded.  General appearance: alert, cooperative and no distress Resp: clear to auscultation bilaterally GI: soft, sore, sites look fine, she is still tender LUQ, but moves well in bed.  BS still hypoacitive, some flatus, not much.  she is not distended. Extremities: extremities normal, atraumatic, no cyanosis or edema  Lab Results:  Recent Labs    04/20/17 1102 04/21/17 0327  WBC 11.9* 10.2  HGB 10.1* 9.3*  HCT 30.4* 28.2*  PLT 329 378    BMET Recent Labs    04/19/17 1819  04/20/17 1102  NA 138 140  K 4.1 3.7  CL 103 109  CO2 25 25  GLUCOSE 136* 110*  BUN 22* 14  CREATININE 1.40* 0.68  CALCIUM 9.2 8.6*   PT/INR Recent Labs    04/19/17 2352  LABPROT 13.2  INR 1.01    Recent Labs  Lab 04/19/17 1819  AST 32  ALT 38  ALKPHOS 95  BILITOT 0.5  PROT 6.9  ALBUMIN 3.4*     Lipase     Component Value Date/Time   LIPASE 21 04/19/2017 1819     Prior to Admission medications   Medication Sig Start Date End Date Taking? Authorizing Provider  ALPRAZolam (XANAX) 0.25 MG tablet Take 0.25-0.5 mg by mouth 3 (three) times daily as needed for anxiety. For anxiety.   Yes [provider]  amoxicillin-clavulanate (AUGMENTIN) 875-125 MG tablet Take 1 tablet by mouth 2 (two) times daily. 04/13/17  Yes Karie Soda, MD  amoxicillin-clavulanate (AUGMENTIN) 875-125 MG tablet Take 1 tablet by mouth 2 (two) times daily.   Yes [provider]  carvedilol (COREG) 12.5 MG tablet Take 12.5 mg by mouth 2 (two) times daily.  09/19/16  Yes [provider]  hydrochlorothiazide (MICROZIDE) 12.5 MG capsule Take 12.5 mg by mouth daily.   Yes [provider]  ibuprofen (ADVIL,MOTRIN) 200 MG tablet Take 400 mg by mouth every 8 (eight) hours as needed for headache, mild pain or moderate pain. For pain.    Yes [provider]  oxyCODONE (OXY IR/ROXICODONE) 5 MG immediate  release tablet Take 1-2 tablets (5-10 mg total) by mouth every 6 (six) hours as needed for moderate pain, severe pain or breakthrough pain. 04/12/17  Yes Karie SodaGross, Steven, MD  venlafaxine XR (EFFEXOR-XR) 75 MG 24 hr capsule Take 150 mg by mouth daily.    Yes [provider]  hydrocortisone-pramoxine [facility](ANALPRAM-HC) 2.5-1 % rectal cream Place rectally 3 (three) times daily. Patient not taking: Reported on 04/11/2017 07/15/12   Harrington ChallengerYoung, Nancy J, NP  Multiple Vitamin (MULTIVITAMIN WITH MINERALS) TABS Take 1 tablet by mouth daily.    [provider]    Medications: .  pantoprazole (PROTONIX) IV  40 mg Intravenous QHS  . venlafaxine XR  150 mg Oral Daily   . dextrose 5 % and 0.9 % NaCl with KCl 20 mEq/L 100 mL/hr at 04/21/17 0708  . methocarbamol (ROBAXIN)  IV Stopped (04/21/17 0414)   Anti-infectives (From admission, onward)   Start     Dose/Rate Route Frequency Ordered Stop   04/19/17 1930  piperacillin-tazobactam (ZOSYN) IVPB 3.375 g  Status:  Discontinued     3.375 g 100 mL/hr over 30 Minutes Intravenous To Emergency Dept 04/19/17 1920 04/19/17 2025      Assessment/Plan  Hypertension Tobacco use Depression/anxiety  Acute splenic hematoma Perforated appendix with suppuration  S/p laparoscopic appendectomy, 04/12/17, Dr. Karie SodaSteven Gross Anemia  - Hemoglobin 10>>8 >> 10.1 - transfused 2 units 04/19/17    FEN:  IV fluids/NPO ID:  Zosyn 04/19/17 =>> day 3 DVT:  SCD's - splenic hematoma Foley:  None Follow up:  TBD  Plan:  Hemodynamically stable, still tender, H/H stable. Observe in ICU another 24 hours, he wants her to be still in the ICU, will give some clears and bedside commode, continue to monitor closely.       LOS: 2 days    Tahlor Berenguer 04/21/2017 [phone]

## 2017-04-22 LAB — CBC
HEMATOCRIT: 28.7 % — AB (ref 36.0–46.0)
HEMOGLOBIN: 9.4 g/dL — AB (ref 12.0–15.0)
MCH: 31.6 pg (ref 26.0–34.0)
MCHC: 32.8 g/dL (ref 30.0–36.0)
MCV: 96.6 fL (ref 78.0–100.0)
Platelets: 436 10*3/uL — ABNORMAL HIGH (ref 150–400)
RBC: 2.97 MIL/uL — AB (ref 3.87–5.11)
RDW: 18.5 % — ABNORMAL HIGH (ref 11.5–15.5)
WBC: 11.7 10*3/uL — AB (ref 4.0–10.5)

## 2017-04-22 LAB — BASIC METABOLIC PANEL
ANION GAP: 9 (ref 5–15)
BUN: <5 mg/dL — ABNORMAL LOW (ref 6–20)
CHLORIDE: 103 mmol/L (ref 101–111)
CO2: 25 mmol/L (ref 22–32)
Calcium: 8.8 mg/dL — ABNORMAL LOW (ref 8.9–10.3)
Creatinine, Ser: 0.66 mg/dL (ref 0.44–1.00)
GFR calc non Af Amer: >60 mL/min (ref >60)
Glucose, Bld: 109 mg/dL — ABNORMAL HIGH (ref 65–99)
POTASSIUM: 4.1 mmol/L (ref 3.5–5.1)
SODIUM: 137 mmol/L (ref 135–145)

## 2017-04-22 MED ORDER — HYDROMORPHONE HCL 1 MG/ML IJ SOLN
0.5000 mg | INTRAMUSCULAR | Status: DC | PRN
  Administered 2017-04-22 – 2017-04-23 (×4): 1 mg via INTRAVENOUS
  Filled 2017-04-22 (×4): qty 1

## 2017-04-22 MED ORDER — CARVEDILOL 12.5 MG PO TABS
12.5000 mg | ORAL_TABLET | Freq: Two times a day (BID) | ORAL | Status: DC
  Administered 2017-04-22 – 2017-04-24 (×4): 12.5 mg via ORAL
  Filled 2017-04-22 (×5): qty 1

## 2017-04-22 MED ORDER — OXYCODONE-ACETAMINOPHEN 5-325 MG PO TABS
1.0000 | ORAL_TABLET | ORAL | Status: DC | PRN
  Administered 2017-04-22 – 2017-04-23 (×3): 2 via ORAL
  Filled 2017-04-22 (×3): qty 2

## 2017-04-22 NOTE — Plan of Care (Signed)
Plan of care reviewed and discussed with patient and husband.

## 2017-04-22 NOTE — Progress Notes (Signed)
Report called to receiving RN [id number].  Patient with no complaints at the current time. Will transfer via WC.

## 2017-04-22 NOTE — Progress Notes (Addendum)
   Subjective/Chief Complaint: Seems ok. Still has trouble taking a deep breath   Objective: Vital signs in last 24 hours: Temp:  [97.6 F (36.4 C)-98.6 F (37 C)] 97.6 F (36.4 C) (03/12 0800) Pulse Rate:  [80-98] 95 (03/12 0800) Resp:  [17-31] 26 (03/12 0800) BP: (90-165)/(50-102) 152/102 (03/12 0800) SpO2:  [88 %-97 %] 92 % (03/12 0800) Last BM Date: 04/19/17  Intake/Output from previous day: 03/11 0701 - 03/12 0700 In: 3415 [P.O.:960; I.V.:2400; IV Piggyback:55] Out: 2100 [Urine:2100] Intake/Output this shift: No intake/output data recorded.  General appearance: alert and cooperative Resp: clear to auscultation bilaterally Cardio: regular rate and rhythm GI: soft, tender only in LUQ  Lab Results:  Recent Labs    04/21/17 0327 04/22/17 0300  WBC 10.2 11.7*  HGB 9.3* 9.4*  HCT 28.2* 28.7*  PLT 378 436*   BMET Recent Labs    04/20/17 1102 04/22/17 0300  NA 140 137  K 3.7 4.1  CL 109 103  CO2 25 25  GLUCOSE 110* 109*  BUN 14 <5*  CREATININE 0.68 0.66  CALCIUM 8.6* 8.8*   PT/INR Recent Labs    04/19/17 2352  LABPROT 13.2  INR 1.01   ABG No results for input([name]): PHART, HCO3 in the last 72 hours.  Invalid input([name]): PCO2, PO2  Studies/Results: No results found.  Anti-infectives: Anti-infectives (From admission, onward)   Start     Dose/Rate Route Frequency Ordered Stop   04/19/17 1930  piperacillin-tazobactam (ZOSYN) IVPB 3.375 g  Status:  Discontinued     3.375 g 100 mL/hr over 30 Minutes Intravenous To Emergency Dept 04/19/17 1920 04/19/17 2025      Assessment/Plan: [name]/p * No surgery found * Advance diet. Start fulls today Daily cbc Transfer to floor Hematoma of spleen stable Start to ambulate BP starting to elevate. Will add back antihypertensive meds  LOS: 3 days    TOTH III,Adrienne [name] 04/22/2017

## 2017-04-23 LAB — BPAM RBC
BLOOD PRODUCT EXPIRATION DATE: 201904082359
Blood Product Expiration Date: 201904072359
Blood Product Expiration Date: 201904082359
Blood Product Expiration Date: 201904082359
ISSUE DATE / TIME: 201903100108
ISSUE DATE / TIME: 201903100520
UNIT TYPE AND RH: 7300
UNIT TYPE AND RH: 7300
UNIT TYPE AND RH: 7300
Unit Type and Rh: 7300

## 2017-04-23 LAB — TYPE AND SCREEN
ABO/RH(D): B POS
Antibody Screen: NEGATIVE
UNIT DIVISION: 0
UNIT DIVISION: 0
UNIT DIVISION: 0
Unit division: 0

## 2017-04-23 LAB — CBC
HEMATOCRIT: 27.4 % — AB (ref 36.0–46.0)
HEMOGLOBIN: 9.1 g/dL — AB (ref 12.0–15.0)
MCH: 31.9 pg (ref 26.0–34.0)
MCHC: 33.2 g/dL (ref 30.0–36.0)
MCV: 96.1 fL (ref 78.0–100.0)
Platelets: 520 10*3/uL — ABNORMAL HIGH (ref 150–400)
RBC: 2.85 MIL/uL — AB (ref 3.87–5.11)
RDW: 17.9 % — ABNORMAL HIGH (ref 11.5–15.5)
WBC: 12.8 10*3/uL — AB (ref 4.0–10.5)

## 2017-04-23 MED ORDER — HYDROMORPHONE HCL 1 MG/ML IJ SOLN: 0.5000 mg | INTRAMUSCULAR | Status: DC | PRN

## 2017-04-23 MED ORDER — OXYCODONE-ACETAMINOPHEN 5-325 MG PO TABS
1.0000 | ORAL_TABLET | ORAL | Status: DC | PRN
  Administered 2017-04-23 – 2017-04-24 (×6): 2 via ORAL
  Filled 2017-04-23 (×6): qty 2

## 2017-04-23 MED ORDER — PANTOPRAZOLE SODIUM 40 MG PO TBEC
40.0000 mg | DELAYED_RELEASE_TABLET | Freq: Every day | ORAL | Status: DC
  Administered 2017-04-23: 40 mg via ORAL
  Filled 2017-04-23: qty 1

## 2017-04-23 MED ORDER — OXYCODONE-ACETAMINOPHEN 5-325 MG PO TABS: 1.0000 | ORAL_TABLET | ORAL | Status: DC | PRN

## 2017-04-23 NOTE — Progress Notes (Signed)
[facility]/Trauma Progress Note      Assessment/Plan Hypertension - home meds Tobacco use Depression/anxiety  Acute splenic hematoma Perforated appendix with suppuration  S/p laparoscopic appendectomy, 04/12/17, Dr. Karie Soda Anemia  - Hg stable- transfused 2 units 04/19/17    FEN:  IVF, full liquids ID:  Zosyn 04/19/17 DVT:  SCD's - splenic hematoma so no chemical prophylaxis  Foley:  None Follow up:  TBD  Plan:  Hemodynamically stable, still tender but improved, H/H stable. Start to ambulate.       LOS: 4 days    Subjective: CC: abdominal pain  Pt states pain is better today. She has been oob to bathroom. Tolerating full liquids without nausea or vomiting. Having flatus. No BM. Husband at bedside asking questions about cost of f/u appt, can they be seen by internal medicine without being charged, was her splenic hematoma a result of surgery. He made a comment that he wants to speak to an outside party cause he feels we are all buddies at [facility] and will try to protect each other. I explained that I had not seen splenic hematoma s/p lap appy so I am not sure if it is related to surgery or not. I stated I could not tell him why this happened. I explained that the body will reabsorb the blood in time.   Objective: Vital signs in last 24 hours: Temp:  [98.2 F (36.8 C)-99.5 F (37.5 C)] 99.5 F (37.5 C) (03/13 2130) Pulse Rate:  [83-89] 85 (03/13 0638) Resp:  [17-24] 20 (03/13 8657) BP: (97-144)/(60-89) 97/60 (03/13 8469) SpO2:  [85 %-97 %] 94 % (03/13 0638) Last BM Date: 04/19/17  Intake/Output from previous day: 03/12 0701 - 03/13 0700 In: 1355 [P.O.:420; I.V.:830; IV Piggyback:105] Out: 450 [Urine:450] Intake/Output this shift: Total I/O In: 480 [P.O.:480] Out: -   PE: Gen:  Alert, NAD, pleasant, cooperative Card:  RRR, no M/G/R heard, 2 + radial pulses bilaterally Pulm:  Diminished breath sounds of b/l bases, no W/R/R, rate and effort normal, she  states she is using IS Abd: Soft, not distended, good BS, incisions appear well healing without signs of infection, TTP in LUQ without guarding Skin: no rashes noted, warm and dry   Anti-infectives: Anti-infectives (From admission, onward)   Start     Dose/Rate Route Frequency Ordered Stop   04/19/17 1930  piperacillin-tazobactam (ZOSYN) IVPB 3.375 g  Status:  Discontinued     3.375 g 100 mL/hr over 30 Minutes Intravenous To Emergency Dept 04/19/17 1920 04/19/17 2025      Lab Results:  Recent Labs    04/22/17 0300 04/23/17 0602  WBC 11.7* 12.8*  HGB 9.4* 9.1*  HCT 28.7* 27.4*  PLT 436* 520*   BMET Recent Labs    04/20/17 1102 04/22/17 0300  NA 140 137  K 3.7 4.1  CL 109 103  CO2 25 25  GLUCOSE 110* 109*  BUN 14 <5*  CREATININE 0.68 0.66  CALCIUM 8.6* 8.8*   PT/INR No results for input(s): LABPROT, INR in the last 72 hours. CMP     Component Value Date/Time   NA 137 04/22/2017 0300   K 4.1 04/22/2017 0300   CL 103 04/22/2017 0300   CO2 25 04/22/2017 0300   GLUCOSE 109 (H) 04/22/2017 0300   BUN <5 (L) 04/22/2017 0300   CREATININE 0.66 04/22/2017 0300   CALCIUM 8.8 (L) 04/22/2017 0300   PROT 6.9 04/19/2017 1819   ALBUMIN 3.4 (L) 04/19/2017 1819   AST 32  04/19/2017 1819   ALT 38 04/19/2017 1819   ALKPHOS 95 04/19/2017 1819   BILITOT 0.5 04/19/2017 1819   GFRNONAA >60 04/22/2017 0300   GFRAA >60 04/22/2017 0300   Lipase     Component Value Date/Time   LIPASE 21 04/19/2017 1819    Studies/Results: No results found.    Jerre SimonJessica L Dean Wonder , [facility] 04/23/2017, 10:07 AM  Pager: [phone] Mon-Wed, Friday 7:00am-4:30pm Thurs 7am-11:30am  Consults: [phone]

## 2017-04-23 NOTE — Progress Notes (Signed)
Key Points: Use following P&T approved IV to PO non-antibiotic change policy.  Description contains the criteria that are approved Note: Policy Excludes:  Esophagectomy patientsPHARMACIST - PHYSICIAN COMMUNICATION DR:   Carolynne Edouardoth CONCERNING: IV to Oral Route Change Policy  RECOMMENDATION: This patient is receiving protonix by the intravenous route.  Based on criteria approved by the Pharmacy and Therapeutics Committee, the intravenous medication(s) is/are being converted to the equivalent oral dose form(s).   DESCRIPTION: These criteria include:  The patient is eating (either orally or via tube) and/or has been taking other orally administered medications for a least 24 hours  The patient has no evidence of active gastrointestinal bleeding or impaired GI absorption (gastrectomy, short bowel, patient on TNA or NPO).  If you have questions about this conversion, please contact the Pharmacy Department  []   [phone]( [phone] )  [facility] []   [phone]( [phone] )  [facility]  []   [phone]( [phone] )  [facility] [x]   [phone]( [phone] )  [facility]  Earl ManyLegge, Traivon Morrical KimballMarshall, [facility] 04/23/2017 11:08 AM

## 2017-04-24 LAB — CBC
HCT: 26.4 % — ABNORMAL LOW (ref 36.0–46.0)
HEMOGLOBIN: 8.8 g/dL — AB (ref 12.0–15.0)
MCH: 32.1 pg (ref 26.0–34.0)
MCHC: 33.3 g/dL (ref 30.0–36.0)
MCV: 96.4 fL (ref 78.0–100.0)
Platelets: 569 10*3/uL — ABNORMAL HIGH (ref 150–400)
RBC: 2.74 MIL/uL — ABNORMAL LOW (ref 3.87–5.11)
RDW: 17.7 % — ABNORMAL HIGH (ref 11.5–15.5)
WBC: 9.2 10*3/uL (ref 4.0–10.5)

## 2017-04-24 LAB — OCCULT BLOOD X 1 CARD TO LAB, STOOL: FECAL OCCULT BLD: NEGATIVE

## 2017-04-24 MED ORDER — OXYCODONE HCL 5 MG PO TABS
5.0000 mg | ORAL_TABLET | ORAL | Status: DC | PRN
  Administered 2017-04-24: 5 mg via ORAL
  Filled 2017-04-24: qty 2

## 2017-04-24 MED ORDER — OXYCODONE HCL 5 MG PO TABS: 5.0000 mg | ORAL_TABLET | ORAL | 0 refills | Status: DC | PRN

## 2017-04-24 MED ORDER — ACETAMINOPHEN 500 MG PO TABS: ORAL_TABLET | ORAL | 0 refills | Status: DC

## 2017-04-24 MED ORDER — TAB-A-VITE/IRON PO TABS: ORAL_TABLET | ORAL | 0 refills | Status: DC

## 2017-04-24 MED ORDER — ACETAMINOPHEN 500 MG PO TABS
1000.0000 mg | ORAL_TABLET | Freq: Four times a day (QID) | ORAL | Status: DC
  Administered 2017-04-24: 14:00:00 1000 mg via ORAL
  Filled 2017-04-24 (×2): qty 2

## 2017-04-24 MED ORDER — TAB-A-VITE/IRON PO TABS
1.0000 | ORAL_TABLET | Freq: Every day | ORAL | Status: DC
  Filled 2017-04-24: qty 1

## 2017-04-24 MED ORDER — METHOCARBAMOL 500 MG PO TABS: 750.0000 mg | ORAL_TABLET | Freq: Four times a day (QID) | ORAL | Status: DC | PRN

## 2017-04-24 MED ORDER — METHOCARBAMOL 750 MG PO TABS: 750.0000 mg | ORAL_TABLET | Freq: Four times a day (QID) | ORAL | 0 refills | Status: DC | PRN

## 2017-04-24 NOTE — Discharge Instructions (Signed)
[facility] ______[facility] LAPAROSCOPIC SURGERY: POST OP INSTRUCTIONS Light activity -walking, no strenuous exercise until you have seen Dr. Michaell Cowing back in the office. Stop smoking if you can.  Always review your discharge instruction sheet given to you by the facility where your surgery was performed. IF YOU HAVE DISABILITY OR FAMILY LEAVE FORMS, YOU MUST BRING THEM TO THE OFFICE FOR PROCESSING.   DO NOT GIVE THEM TO YOUR DOCTOR.  1. A prescription for pain medication may be given to you upon discharge.  Take your pain medication as prescribed, if needed.  If narcotic pain medicine is not needed, then you may take acetaminophen (Tylenol) or ibuprofen (Advil) as needed. 2. Take your usually prescribed medications unless otherwise directed. 3. If you need a refill on your pain medication, please contact your pharmacy.  They will contact our office to request authorization. Prescriptions will not be filled after 5pm or on week-ends. 4. You should follow a light diet the first few days after arrival home, such as soup and crackers, etc.  Be sure to include lots of fluids daily. 5. Most patients will experience some swelling and bruising in the area of the incisions.  Ice packs will help.  Swelling and bruising can take several days to resolve.  6. It is common to experience some constipation if taking pain medication after surgery.  Increasing fluid intake and taking a stool softener (such as Colace) will usually help or prevent this problem from occurring.  A mild laxative (Milk of Magnesia or Miralax) should be taken according to package instructions if there are no bowel movements after 48 hours. 7. Unless discharge instructions indicate otherwise, you may remove your bandages 24-48 hours after surgery, and you may shower at that time.  You may have steri-strips (small skin tapes) in place directly over the incision.  These strips should be left on the skin for 7-10 days.  If your surgeon  used skin glue on the incision, you may shower in 24 hours.  The glue will flake off over the next 2-3 weeks.  Any sutures or staples will be removed at the office during your follow-up visit. 8. ACTIVITIES:  You may resume regular (light) daily activities beginning the next day--such as daily self-care, walking, climbing stairs--gradually increasing activities as tolerated.  You may have sexual intercourse when it is comfortable.  Refrain from any heavy lifting or straining until approved by your doctor. a. You may drive when you are no longer taking prescription pain medication, you can comfortably wear a seatbelt, and you can safely maneuver your car and apply brakes. b. RETURN TO WORK:  __________________________________________________________ 9. You should see your doctor in the office for a follow-up appointment approximately 2-3 weeks after your surgery.  Make sure that you call for this appointment within a day or two after you arrive home to insure a convenient appointment time. 10. OTHER INSTRUCTIONS: __________________________________________________________________________________________________________________________ __________________________________________________________________________________________________________________________ WHEN TO CALL YOUR DOCTOR: 1. Fever over 101.0 2. Inability to urinate 3. Continued bleeding from incision. 4. Increased pain, redness, or drainage from the incision. 5. Increasing abdominal pain  The clinic staff is available to answer your questions during regular business hours.  Please dont hesitate to call and ask to speak to one of the nurses for clinical concerns.  If you have a medical emergency, go to the nearest emergency room or call 911.  A surgeon from [facility] is always on call at the hospital. [address], [address], [place],    [id number] ? [address] [address], [place], [place]   [id number] [phone] ?  [phone] ? FAX [phone] Web site: [email or web address]   Steps to Quit Smoking Smoking tobacco can be bad for your health. It can also affect almost every organ in your body. Smoking puts you and people around you at risk for many serious long-lasting (chronic) diseases. Quitting smoking is hard, but it is one of the best things that you can do for your health. It is never too late to quit. What are the benefits of quitting smoking? When you quit smoking, you lower your risk for getting serious diseases and conditions. They can include:  Lung cancer or lung disease.  Heart disease.  Stroke.  Heart attack.  Not being able to have children (infertility).  Weak bones (osteoporosis) and broken bones (fractures).  If you have coughing, wheezing, and shortness of breath, those symptoms may get better when you quit. You may also get sick less often. If you are pregnant, quitting smoking can help to lower your chances of having a baby of low birth weight. What can I do to help me quit smoking? Talk with your doctor about what can help you quit smoking. Some things you can do (strategies) include:  Quitting smoking totally, instead of slowly cutting back how much you smoke over a period of time.  Going to in-person counseling. You are more likely to quit if you go to many counseling sessions.  Using resources and support systems, such as: ? Agricultural engineernline chats with a Veterinary surgeoncounselor. ? Phone quitlines. ? Automotive engineerrinted self-help materials. ? Support groups or group counseling. ? Text messaging programs. ? Mobile phone apps or applications.  Taking medicines. Some of these medicines may have nicotine in them. If you are pregnant or breastfeeding, do not take any medicines to quit smoking unless your doctor says it is okay. Talk with your doctor about counseling or other things that can help you.  Talk with your doctor about using more than one strategy at the same time, such as taking  medicines while you are also going to in-person counseling. This can help make quitting easier. What things can I do to make it easier to quit? Quitting smoking might feel very hard at first, but there is a lot that you can do to make it easier. Take these steps:  Talk to your family and friends. Ask them to support and encourage you.  Call phone quitlines, reach out to support groups, or work with a Veterinary surgeoncounselor.  Ask people who smoke to not smoke around you.  Avoid places that make you want (trigger) to smoke, such as: ? Bars. ? Parties. ? Smoke-break areas at work.  Spend time with people who do not smoke.  Lower the stress in your life. Stress can make you want to smoke. Try these things to help your stress: ? Getting regular exercise. ? Deep-breathing exercises. ? Yoga. ? Meditating. ? Doing a body scan. To do this, close your eyes, focus on one area of your body at a time from head to toe, and notice which parts of your body are tense. Try to relax the muscles in those areas.  Download or buy apps on your mobile phone or tablet that can help you stick to your quit plan. There are many free apps, such as QuitGuide from the [employer] developer(Centers for Disease Control and Prevention). You can find more support from smokefree.gov and other websites.  This information is not intended to replace  advice given to you by your health care provider. Make sure you discuss any questions you have with your health care provider. Document Released: 11/24/2008 Document Revised: 09/26/2015 Document Reviewed: 06/14/2014 Elsevier Interactive Patient Education  2018 [employer].

## 2017-04-24 NOTE — Discharge Summary (Signed)
Physician Discharge Summary  Patient ID: Adrienne [name] MRN: [id number] DOB/AGE: 17-Apr-1960 57 y.o.  Admit date: 04/19/2017 Discharge date: 04/28/2017  Admission Diagnoses:   Acute splenic hematoma Perforated appendix with suppuration  S/p laparoscopic appendectomy, 04/12/17, Dr. 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  The pain is more diffuse now.  She denies any fevers or chills.  She is one-week status post laparoscopic appendectomy.  She was doing very well until this morning when the pain started.  She denies any trauma.  She came to the emergency department where a CT scan shows a splenic hematoma with a small amount of blood in the abdomen.  Her hemoglobin is 10.  Her systolic blood pressure is around 100 with a heart rate of 90.  Pt was seen and admitted by Dr. Carolynne Edouardoth.  H/H dropped to 8/23.3.  Her H/H was 15/43.2 on admit 04/11/17 with her appendix.  She was transfused with 2 units of PRBC and H/H improved.   She remained hemodynamically stable but continued to have issue with LUQ pain.  This is slowly improving.  Her diet has been advanced and she is ambulating in the halls.  She is concerned with her stools being dark and stool occult is pending.  On rounds this afternoon pt ask to go home.  She is being transitioned to plain Tylenol and prn Oxycodone.  She took 10 tablets of 5/325 Percocet yesterday for pain along with 1 dose of dilaudid.  This is down from PCA use prior for pain control.  If her stool occult is negative we will let her go home today and follow up with Dr. Carolynne Edouardoth.  We have recommended she discontinue smoking if possible.  Stool occult was  negative and she was sent home.   CBC Latest Ref Rng & Units 04/24/2017 04/23/2017 04/22/2017  WBC 4.0 - 10.5 K/uL 9.2 12.8(H) 11.7(H)  Hemoglobin 12.0 - 15.0 g/dL 4.0(J8.8(L) 8.1(X9.1(L) 9.1(Y9.4(L)  Hematocrit 36.0 - 46.0 % 26.4(L) 27.4(L) 28.7(L)  Platelets 150 - 400 K/uL 569(H) 520(H) 436(H)   CMP Latest Ref Rng & Units 04/22/2017 04/20/2017 04/19/2017  Glucose 65 - 99 mg/dL 782(N109(H) 562(Z110(H) 308(M136(H)  BUN 6 - 20 mg/dL <5(H<5(L) 14 84(O22(H)  Creatinine 0.44 - 1.00 mg/dL 9.620.66 9.520.68 8.41(L1.40(H)  Sodium 135 - 145 mmol/L 137 140 138  Potassium 3.5 - 5.1 mmol/L 4.1 3.7 4.1  Chloride 101 - 111 mmol/L 103 109 103  CO2 22 - 32 mmol/L 25 25 25   Calcium 8.9 - 10.3 mg/dL 2.4(M8.8(L) 0.1(U8.6(L) 9.2  Total Protein 6.5 - 8.1 g/dL - - 6.9  Total Bilirubin 0.3 - 1.2 mg/dL - - 0.5  Alkaline Phos 38 - 126 U/L - - 95  AST 15 - 41 U/L - - 32  ALT 14 - 54 U/L - - 38               Disposition:    Allergies as of 04/24/2017      Reactions   Morphine And Related Itching      Medication List    STOP taking these medications   amoxicillin-clavulanate 875-125 MG tablet Commonly known as:  AUGMENTIN   ibuprofen 200 MG  tablet Commonly known as:  ADVIL,MOTRIN   multivitamin with minerals Tabs tablet     TAKE these medications   acetaminophen 500 MG tablet Commonly known as:  TYLENOL You can take two 500 mg tablets every 6 hours for pain.  I would do this around the clock until you are no longer in need of pain medications.  If you have 325 mg tablets at home you can take 3 tablets every 6 hours for pain.  If plain Tylenol is not adequate relieve your pain, you can use the oxycodone.  Both strengths of Tylenol can be bought over-the-counter at any drugstore.  Do not exceed 4000 mg of Tylenol per day, larger doses can harm you   ALPRAZolam 0.25 MG tablet Commonly known as:  XANAX Take 0.25-0.5 mg by mouth 3 (three) times daily as needed for anxiety. For anxiety.   carvedilol 12.5 MG tablet Commonly known as:  COREG Take 12.5  mg by mouth 2 (two) times daily.   hydrochlorothiazide 12.5 MG capsule Commonly known as:  MICROZIDE Take 12.5 mg by mouth daily.   hydrocortisone-pramoxine 2.5-1 % rectal cream Commonly known as:  ANALPRAM-HC Place rectally 3 (three) times daily.   methocarbamol 750 MG tablet Commonly known as:  ROBAXIN Take 1 tablet (750 mg total) by mouth every 6 (six) hours as needed for muscle spasms.   multivitamins with iron Tabs tablet You need a multivitamin with iron to help rebuild your blood.  Be sure your multivitamin has iron.  You can buy this over the counter at any drug store.   oxyCODONE 5 MG immediate release tablet Commonly known as:  Oxy IR/ROXICODONE Take 1-2 tablets (5-10 mg total) by mouth every 4 (four) hours as needed for moderate pain, severe pain or breakthrough pain. What changed:  when to take this   venlafaxine XR 75 MG 24 hr capsule Commonly known as:  EFFEXOR-XR Take 150 mg by mouth daily.      Follow-up Information    Karie Soda, MD Follow up.   Specialty:  General Surgery Why:  Our office will call with follow-up with Dr. gross.  If you do not hear tomorrow, call on Monday, 04/28/17 for follow-up.Benay Pillow information: [address] [place] [address] [phone]        Barbie Banner, MD Follow up.   Specialty:  Family Medicine Why:  Call Dr. Andrey Campanile and follow-up in 2 weeks.  Let him know what is happened, and he can also follow-up on your hemoglobin and hematocrit.  See Dr. Andrey Campanile for other medical issues. Contact information: [address] [place] [address] [phone]           Signed: Sherrie George 04/28/2017, 2:38 PM

## 2017-04-24 NOTE — Progress Notes (Signed)
    CC:  Increased abdominal pain after appendectomy   Subjective: Still complaining of pain LUQ.  Abdomen looks fine, tender on palpation.  Site look great.  Tolerating diet and reports a BM, she is worried about blood in it.    Objective: Vital signs in last 24 hours: Temp:  [98.4 F (36.9 C)-99 F (37.2 C)] 98.4 F (36.9 C) (03/14 0611) Pulse Rate:  [86-98] 98 (03/14 0852) Resp:  [19-20] 20 (03/14 0611) BP: (91-134)/(63-85) 134/85 (03/14 0852) SpO2:  [90 %-92 %] 90 % (03/14 0611) Last BM Date: 04/23/17 480 Po recorded 2170 IV Urine x 1 recorded No BM recorded Pain:  Dilaudid x 1 yesterday            Oxycodone 10 tablets yesterday Afebrile, VSS H/H stable  CBC Latest Ref Rng & Units 04/24/2017 04/23/2017 04/22/2017  WBC 4.0 - 10.5 K/uL 9.2 12.8(H) 11.7(H)  Hemoglobin 12.0 - 15.0 g/dL 1.6(X8.8(L) 0.9(U9.1(L) 0.4(V9.4(L)  Hematocrit 36.0 - 46.0 % 26.4(L) 27.4(L) 28.7(L)  Platelets 150 - 400 K/uL 569(H) 520(H) 436(H)   Intake/Output from previous day: 03/13 0701 - 03/14 0700 In: 2650 [P.O.:480; I.V.:2170] Out: -  Intake/Output this shift: Total I/O In: 240 [P.O.:240] Out: -   General appearance: alert, cooperative and no distress Resp: clear to auscultation bilaterally GI: soft, tender LUQ, no brusing or ecchymosis.  Port sites look good.  Lab Results:  Recent Labs    04/23/17 0602 04/24/17 0615  WBC 12.8* 9.2  HGB 9.1* 8.8*  HCT 27.4* 26.4*  PLT 520* 569*    BMET Recent Labs    04/22/17 0300  NA 137  K 4.1  CL 103  CO2 25  GLUCOSE 109*  BUN <5*  CREATININE 0.66  CALCIUM 8.8*   PT/INR No results for input(s): LABPROT, INR in the last 72 hours.  Recent Labs  Lab 04/19/17 1819  AST 32  ALT 38  ALKPHOS 95  BILITOT 0.5  PROT 6.9  ALBUMIN 3.4*     Lipase     Component Value Date/Time   LIPASE 21 04/19/2017 1819     Medications: . carvedilol  12.5 mg Oral BID WC  . nicotine  7 mg Transdermal Daily  . pantoprazole  40 mg Oral QHS  . venlafaxine  XR  150 mg Oral Daily    Assessment/Plan Hypertension - home meds Tobacco use Depression/anxiety  Acute splenic hematoma Perforated appendix with suppuration  S/p laparoscopic appendectomy, 04/12/17, Dr. Karie SodaSteven Gross Anemia - Hg stable- transfused 2 units 04/19/17    FEN: IVF, regular diet ID: Zosyn 04/19/17 DVT: SCD's - splenic hematoma so no chemical prophylaxis  Foley: None Follow up: TBD   Plan:  I told her to get up and walk.  I told her we could not send her home on 10 Percocet per day so we are going to try Scheduled Tylenol and PO oxycodone.  Ambulate in the halls, check stool for occult.            LOS: 5 days    Darvis Croft 04/24/2017 [id number] [phone]

## 2017-04-25 LAB — CULTURE, BLOOD (ROUTINE X 2)
CULTURE: NO GROWTH
CULTURE: NO GROWTH
SPECIAL REQUESTS: ADEQUATE
Special Requests: ADEQUATE

## 2017-05-14 ENCOUNTER — Emergency Department (HOSPITAL_COMMUNITY): Payer: BC Managed Care – PPO

## 2017-05-14 ENCOUNTER — Encounter (HOSPITAL_COMMUNITY): Admission: EM | Disposition: A | Discharge status: Home / Self Care | Payer: Self-pay | Source: Home / Self Care

## 2017-05-14 ENCOUNTER — Other Ambulatory Visit: Payer: Self-pay

## 2017-05-14 ENCOUNTER — Inpatient Hospital Stay (HOSPITAL_COMMUNITY): Payer: BC Managed Care – PPO

## 2017-05-14 ENCOUNTER — Inpatient Hospital Stay (HOSPITAL_COMMUNITY)
Admission: EM | Admit: 2017-05-14 | Discharge: 2017-05-16 | DRG: 801 | Disposition: A | Discharge status: Home / Self Care | Payer: BC Managed Care – PPO | Attending: Surgery | Admitting: Surgery

## 2017-05-14 ENCOUNTER — Encounter (HOSPITAL_COMMUNITY): Payer: Self-pay | Admitting: Emergency Medicine

## 2017-05-14 ENCOUNTER — Inpatient Hospital Stay (HOSPITAL_COMMUNITY): Payer: BC Managed Care – PPO | Admitting: Certified Registered Nurse Anesthetist

## 2017-05-14 DIAGNOSIS — Z720 Tobacco use: Secondary | ICD-10-CM | POA: Diagnosis present

## 2017-05-14 DIAGNOSIS — D735 Infarction of spleen: Principal | ICD-10-CM | POA: Diagnosis present

## 2017-05-14 DIAGNOSIS — D7389 Other diseases of spleen: Secondary | ICD-10-CM

## 2017-05-14 DIAGNOSIS — Z01818 Encounter for other preprocedural examination: Secondary | ICD-10-CM

## 2017-05-14 DIAGNOSIS — E7800 Pure hypercholesterolemia, unspecified: Secondary | ICD-10-CM | POA: Diagnosis present

## 2017-05-14 DIAGNOSIS — I10 Essential (primary) hypertension: Secondary | ICD-10-CM | POA: Diagnosis present

## 2017-05-14 DIAGNOSIS — S36029A Unspecified contusion of spleen, initial encounter: Secondary | ICD-10-CM | POA: Diagnosis present

## 2017-05-14 DIAGNOSIS — K3532 Acute appendicitis with perforation and localized peritonitis, without abscess: Secondary | ICD-10-CM | POA: Diagnosis present

## 2017-05-14 DIAGNOSIS — F17210 Nicotine dependence, cigarettes, uncomplicated: Secondary | ICD-10-CM | POA: Diagnosis present

## 2017-05-14 DIAGNOSIS — D649 Anemia, unspecified: Secondary | ICD-10-CM | POA: Diagnosis present

## 2017-05-14 DIAGNOSIS — F419 Anxiety disorder, unspecified: Secondary | ICD-10-CM | POA: Diagnosis present

## 2017-05-14 DIAGNOSIS — M62838 Other muscle spasm: Secondary | ICD-10-CM | POA: Diagnosis present

## 2017-05-14 HISTORY — PX: SPLENECTOMY, TOTAL: SHX788

## 2017-05-14 LAB — COMPREHENSIVE METABOLIC PANEL
ALK PHOS: 143 U/L — AB (ref 38–126)
ALT: 33 U/L (ref 14–54)
AST: 29 U/L (ref 15–41)
Albumin: 3.4 g/dL — ABNORMAL LOW (ref 3.5–5.0)
Anion gap: 12 (ref 5–15)
BILIRUBIN TOTAL: 0.5 mg/dL (ref 0.3–1.2)
BUN: 13 mg/dL (ref 6–20)
CALCIUM: 9.5 mg/dL (ref 8.9–10.3)
CO2: 26 mmol/L (ref 22–32)
CREATININE: 0.7 mg/dL (ref 0.44–1.00)
Chloride: 99 mmol/L — ABNORMAL LOW (ref 101–111)
Glucose, Bld: 97 mg/dL (ref 65–99)
Potassium: 3.9 mmol/L (ref 3.5–5.1)
SODIUM: 137 mmol/L (ref 135–145)
TOTAL PROTEIN: 7.4 g/dL (ref 6.5–8.1)

## 2017-05-14 LAB — CBC WITH DIFFERENTIAL/PLATELET
BASOS ABS: 0.1 10*3/uL (ref 0.0–0.1)
BASOS PCT: 1 %
EOS ABS: 0.2 10*3/uL (ref 0.0–0.7)
Eosinophils Relative: 2 %
HEMATOCRIT: 32.9 % — AB (ref 36.0–46.0)
HEMOGLOBIN: 10.6 g/dL — AB (ref 12.0–15.0)
Lymphocytes Relative: 31 %
Lymphs Abs: 3.1 10*3/uL (ref 0.7–4.0)
MCH: 30.2 pg (ref 26.0–34.0)
MCHC: 32.2 g/dL (ref 30.0–36.0)
MCV: 93.7 fL (ref 78.0–100.0)
Monocytes Absolute: 0.9 10*3/uL (ref 0.1–1.0)
Monocytes Relative: 9 %
NEUTROS ABS: 5.7 10*3/uL (ref 1.7–7.7)
NEUTROS PCT: 57 %
Platelets: 563 10*3/uL — ABNORMAL HIGH (ref 150–400)
RBC: 3.51 MIL/uL — ABNORMAL LOW (ref 3.87–5.11)
RDW: 16.2 % — ABNORMAL HIGH (ref 11.5–15.5)
WBC: 10 10*3/uL (ref 4.0–10.5)

## 2017-05-14 LAB — LIPASE, BLOOD: LIPASE: 28 U/L (ref 11–51)

## 2017-05-14 LAB — PREPARE RBC (CROSSMATCH)

## 2017-05-14 SURGERY — SPLENECTOMY
Anesthesia: General

## 2017-05-14 MED ORDER — HYDROMORPHONE 1 MG/ML IV SOLN
INTRAVENOUS | Status: DC
  Administered 2017-05-14: 3 mg via INTRAVENOUS
  Administered 2017-05-14: 2.4 mg via INTRAVENOUS
  Administered 2017-05-14: 13:00:00 via INTRAVENOUS
  Administered 2017-05-14: 0.6 mg via INTRAVENOUS
  Administered 2017-05-15: 3.3 mg via INTRAVENOUS
  Administered 2017-05-15 (×2): 1.8 mg via INTRAVENOUS
  Administered 2017-05-15 (×3): 1.2 mg via INTRAVENOUS
  Administered 2017-05-16: 1 mg via INTRAVENOUS
  Filled 2017-05-14: qty 25

## 2017-05-14 MED ORDER — MIDAZOLAM HCL 5 MG/5ML IJ SOLN
INTRAMUSCULAR | Status: DC | PRN
  Administered 2017-05-14: 1 mg via INTRAVENOUS

## 2017-05-14 MED ORDER — ROCURONIUM BROMIDE 10 MG/ML (PF) SYRINGE
PREFILLED_SYRINGE | INTRAVENOUS | Status: AC
  Filled 2017-05-14: qty 5

## 2017-05-14 MED ORDER — HYDROMORPHONE HCL 1 MG/ML IJ SOLN
0.5000 mg | Freq: Once | INTRAMUSCULAR | Status: AC
  Administered 2017-05-14: 0.5 mg via INTRAVENOUS
  Filled 2017-05-14: qty 1

## 2017-05-14 MED ORDER — LIDOCAINE 2% (20 MG/ML) 5 ML SYRINGE
INTRAMUSCULAR | Status: DC | PRN
  Administered 2017-05-14: 80 mg via INTRAVENOUS

## 2017-05-14 MED ORDER — ALPRAZOLAM 0.25 MG PO TABS
0.2500 mg | ORAL_TABLET | Freq: Three times a day (TID) | ORAL | Status: DC | PRN
  Administered 2017-05-15: 0.5 mg via ORAL
  Filled 2017-05-14: qty 1

## 2017-05-14 MED ORDER — LACTATED RINGERS IV SOLN
INTRAVENOUS | Status: DC
  Administered 2017-05-14: 11:00:00 via INTRAVENOUS

## 2017-05-14 MED ORDER — SODIUM CHLORIDE 0.9% FLUSH: 9.0000 mL | INTRAVENOUS | Status: DC | PRN

## 2017-05-14 MED ORDER — PROPOFOL 10 MG/ML IV BOLUS
INTRAVENOUS | Status: DC | PRN
  Administered 2017-05-14: 150 mg via INTRAVENOUS

## 2017-05-14 MED ORDER — GLYCOPYRROLATE 0.2 MG/ML IJ SOLN
INTRAMUSCULAR | Status: DC | PRN
  Administered 2017-05-14: 0.2 mg via INTRAVENOUS

## 2017-05-14 MED ORDER — LIDOCAINE 2% (20 MG/ML) 5 ML SYRINGE
INTRAMUSCULAR | Status: AC
  Filled 2017-05-14: qty 5

## 2017-05-14 MED ORDER — OXYCODONE HCL 5 MG PO TABS: 5.0000 mg | ORAL_TABLET | Freq: Once | ORAL | Status: DC | PRN

## 2017-05-14 MED ORDER — LACTATED RINGERS IV SOLN
INTRAVENOUS | Status: DC
  Administered 2017-05-14 (×2): via INTRAVENOUS

## 2017-05-14 MED ORDER — SODIUM CHLORIDE 0.9 % IV BOLUS
1000.0000 mL | Freq: Once | INTRAVENOUS | Status: AC
  Administered 2017-05-14: 1000 mL via INTRAVENOUS

## 2017-05-14 MED ORDER — METOPROLOL TARTRATE 5 MG/5ML IV SOLN: 5.0000 mg | Freq: Four times a day (QID) | INTRAVENOUS | Status: DC | PRN

## 2017-05-14 MED ORDER — MIDAZOLAM HCL 2 MG/2ML IJ SOLN
INTRAMUSCULAR | Status: AC
Start: 2017-05-14 — End: 2017-05-14
  Filled 2017-05-14: qty 2

## 2017-05-14 MED ORDER — HYDROMORPHONE HCL 1 MG/ML IJ SOLN
INTRAMUSCULAR | Status: AC
  Filled 2017-05-14: qty 1

## 2017-05-14 MED ORDER — FENTANYL CITRATE (PF) 100 MCG/2ML IJ SOLN
INTRAMUSCULAR | Status: AC
  Filled 2017-05-14: qty 2

## 2017-05-14 MED ORDER — DEXAMETHASONE SODIUM PHOSPHATE 10 MG/ML IJ SOLN
INTRAMUSCULAR | Status: AC
  Filled 2017-05-14: qty 1

## 2017-05-14 MED ORDER — DIPHENHYDRAMINE HCL 50 MG/ML IJ SOLN: 12.5000 mg | Freq: Four times a day (QID) | INTRAMUSCULAR | Status: DC | PRN

## 2017-05-14 MED ORDER — IOPAMIDOL (ISOVUE-300) INJECTION 61%
INTRAVENOUS | Status: AC
  Filled 2017-05-14: qty 100

## 2017-05-14 MED ORDER — NALOXONE HCL 0.4 MG/ML IJ SOLN: 0.4000 mg | INTRAMUSCULAR | Status: DC | PRN

## 2017-05-14 MED ORDER — PROMETHAZINE HCL 25 MG/ML IJ SOLN: 6.2500 mg | INTRAMUSCULAR | Status: DC | PRN

## 2017-05-14 MED ORDER — SUGAMMADEX SODIUM 200 MG/2ML IV SOLN
INTRAVENOUS | Status: AC
  Filled 2017-05-14: qty 2

## 2017-05-14 MED ORDER — HYDROMORPHONE HCL 1 MG/ML IJ SOLN
0.2500 mg | INTRAMUSCULAR | Status: DC | PRN
  Administered 2017-05-14 (×4): 0.5 mg via INTRAVENOUS

## 2017-05-14 MED ORDER — ALBUMIN HUMAN 5 % IV SOLN
INTRAVENOUS | Status: DC | PRN
  Administered 2017-05-14: 12:00:00 via INTRAVENOUS

## 2017-05-14 MED ORDER — SUCCINYLCHOLINE CHLORIDE 200 MG/10ML IV SOSY
PREFILLED_SYRINGE | INTRAVENOUS | Status: AC
  Filled 2017-05-14: qty 10

## 2017-05-14 MED ORDER — ACETAMINOPHEN 500 MG PO TABS
1000.0000 mg | ORAL_TABLET | Freq: Four times a day (QID) | ORAL | Status: DC
  Administered 2017-05-14 – 2017-05-16 (×7): 1000 mg via ORAL
  Filled 2017-05-14 (×7): qty 2

## 2017-05-14 MED ORDER — PROPOFOL 10 MG/ML IV BOLUS
INTRAVENOUS | Status: AC
  Filled 2017-05-14: qty 20

## 2017-05-14 MED ORDER — KCL IN DEXTROSE-NACL 20-5-0.45 MEQ/L-%-% IV SOLN
INTRAVENOUS | Status: DC
  Administered 2017-05-15: 125 mL/h via INTRAVENOUS
  Filled 2017-05-14 (×3): qty 1000

## 2017-05-14 MED ORDER — ROCURONIUM BROMIDE 50 MG/5ML IV SOSY
PREFILLED_SYRINGE | INTRAVENOUS | Status: DC | PRN
  Administered 2017-05-14: 40 mg via INTRAVENOUS
  Administered 2017-05-14: 20 mg via INTRAVENOUS

## 2017-05-14 MED ORDER — FENTANYL CITRATE (PF) 250 MCG/5ML IJ SOLN
INTRAMUSCULAR | Status: AC
  Filled 2017-05-14: qty 5

## 2017-05-14 MED ORDER — FENTANYL CITRATE (PF) 100 MCG/2ML IJ SOLN
25.0000 ug | INTRAMUSCULAR | Status: DC | PRN
  Administered 2017-05-14 (×2): 50 ug via INTRAVENOUS

## 2017-05-14 MED ORDER — VENLAFAXINE HCL ER 150 MG PO CP24
150.0000 mg | ORAL_CAPSULE | Freq: Every day | ORAL | Status: DC
  Administered 2017-05-15 – 2017-05-16 (×2): 150 mg via ORAL
  Filled 2017-05-14 (×2): qty 1

## 2017-05-14 MED ORDER — DIPHENHYDRAMINE HCL 12.5 MG/5ML PO ELIX: 12.5000 mg | ORAL_SOLUTION | Freq: Four times a day (QID) | ORAL | Status: DC | PRN

## 2017-05-14 MED ORDER — FENTANYL CITRATE (PF) 100 MCG/2ML IJ SOLN
50.0000 ug | Freq: Once | INTRAMUSCULAR | Status: AC | PRN
  Administered 2017-05-14: 50 ug via INTRAVENOUS
  Filled 2017-05-14: qty 2

## 2017-05-14 MED ORDER — IOPAMIDOL (ISOVUE-300) INJECTION 61%
100.0000 mL | Freq: Once | INTRAVENOUS | Status: AC | PRN
  Administered 2017-05-14: 100 mL via INTRAVENOUS

## 2017-05-14 MED ORDER — POLYETHYLENE GLYCOL 3350 17 G PO PACK: 17.0000 g | PACK | Freq: Every day | ORAL | Status: DC | PRN

## 2017-05-14 MED ORDER — ONDANSETRON HCL 4 MG/2ML IJ SOLN
INTRAMUSCULAR | Status: AC
  Filled 2017-05-14: qty 2

## 2017-05-14 MED ORDER — OXYCODONE HCL 5 MG/5ML PO SOLN: 5.0000 mg | Freq: Once | ORAL | Status: DC | PRN

## 2017-05-14 MED ORDER — DEXAMETHASONE SODIUM PHOSPHATE 10 MG/ML IJ SOLN
INTRAMUSCULAR | Status: DC | PRN
  Administered 2017-05-14: 10 mg via INTRAVENOUS

## 2017-05-14 MED ORDER — ONDANSETRON HCL 4 MG/2ML IJ SOLN
INTRAMUSCULAR | Status: DC | PRN
  Administered 2017-05-14: 4 mg via INTRAVENOUS

## 2017-05-14 MED ORDER — 0.9 % SODIUM CHLORIDE (POUR BTL) OPTIME
TOPICAL | Status: DC | PRN
  Administered 2017-05-14: 2000 mL

## 2017-05-14 MED ORDER — CEFAZOLIN SODIUM-DEXTROSE 2-4 GM/100ML-% IV SOLN
2.0000 g | INTRAVENOUS | Status: AC
  Administered 2017-05-14: 2 g via INTRAVENOUS
  Filled 2017-05-14: qty 100

## 2017-05-14 MED ORDER — POTASSIUM CHLORIDE 2 MEQ/ML IV SOLN: INTRAVENOUS | Status: DC

## 2017-05-14 MED ORDER — SUCCINYLCHOLINE CHLORIDE 200 MG/10ML IV SOSY
PREFILLED_SYRINGE | INTRAVENOUS | Status: DC | PRN
  Administered 2017-05-14: 100 mg via INTRAVENOUS

## 2017-05-14 MED ORDER — HYDROMORPHONE HCL 1 MG/ML IJ SOLN: 0.5000 mg | INTRAMUSCULAR | Status: DC | PRN

## 2017-05-14 MED ORDER — GLYCOPYRROLATE 0.2 MG/ML IV SOSY
PREFILLED_SYRINGE | INTRAVENOUS | Status: AC
  Filled 2017-05-14: qty 5

## 2017-05-14 MED ORDER — ONDANSETRON HCL 4 MG/2ML IJ SOLN: 4.0000 mg | Freq: Four times a day (QID) | INTRAMUSCULAR | Status: DC | PRN

## 2017-05-14 MED ORDER — ALBUMIN HUMAN 5 % IV SOLN
INTRAVENOUS | Status: AC
  Filled 2017-05-14: qty 250

## 2017-05-14 MED ORDER — FENTANYL CITRATE (PF) 100 MCG/2ML IJ SOLN
INTRAMUSCULAR | Status: DC | PRN
  Administered 2017-05-14 (×5): 50 ug via INTRAVENOUS

## 2017-05-14 MED ORDER — SUGAMMADEX SODIUM 200 MG/2ML IV SOLN
INTRAVENOUS | Status: DC | PRN
  Administered 2017-05-14: 200 mg via INTRAVENOUS

## 2017-05-14 SURGICAL SUPPLY — 46 items
BLADE EXTENDED COATED 6.5IN (ELECTRODE) ×3 IMPLANT
BLADE HEX COATED 2.75 (ELECTRODE) ×3 IMPLANT
CHLORAPREP W/TINT 26ML (MISCELLANEOUS) ×3 IMPLANT
CLIP VESOCCLUDE LG 6/CT (CLIP) IMPLANT
COVER MAYO STAND STRL (DRAPES) ×3 IMPLANT
COVER SURGICAL LIGHT HANDLE (MISCELLANEOUS) ×3 IMPLANT
DRAPE LAPAROSCOPIC ABDOMINAL (DRAPES) ×3 IMPLANT
DRAPE SHEET LG 3/4 BI-LAMINATE (DRAPES) IMPLANT
DRAPE WARM FLUID 44X44 (DRAPE) ×3 IMPLANT
DRSG OPSITE POSTOP 4X10 (GAUZE/BANDAGES/DRESSINGS) ×3 IMPLANT
ELECT REM PT RETURN 15FT ADLT (MISCELLANEOUS) ×3 IMPLANT
GAUZE SPONGE 4X4 12PLY STRL (GAUZE/BANDAGES/DRESSINGS) ×3 IMPLANT
GLOVE BIOGEL PI IND STRL 7.0 (GLOVE) ×1 IMPLANT
GLOVE BIOGEL PI INDICATOR 7.0 (GLOVE) ×2
GLOVE SURG SIGNA 7.5 PF LTX (GLOVE) ×9 IMPLANT
GOWN STRL REUS W/ TWL XL LVL3 (GOWN DISPOSABLE) ×1 IMPLANT
GOWN STRL REUS W/TWL LRG LVL3 (GOWN DISPOSABLE) ×6 IMPLANT
GOWN STRL REUS W/TWL XL LVL3 (GOWN DISPOSABLE) ×5 IMPLANT
HANDLE SUCTION POOLE (INSTRUMENTS) ×1 IMPLANT
HEMOSTAT SNOW SURGICEL 2X4 (HEMOSTASIS) ×6 IMPLANT
KIT BASIN OR (CUSTOM PROCEDURE TRAY) ×3 IMPLANT
NS IRRIG 1000ML POUR BTL (IV SOLUTION) ×6 IMPLANT
PACK GENERAL/GYN (CUSTOM PROCEDURE TRAY) ×3 IMPLANT
SEALER TISSUE X1 CVD JAW (INSTRUMENTS) IMPLANT
SHEARS FOC LG CVD HARMONIC 17C (MISCELLANEOUS) IMPLANT
SHEARS HARMONIC ACE PLUS 36CM (ENDOMECHANICALS) IMPLANT
SPONGE LAP 18X18 X RAY DECT (DISPOSABLE) ×9 IMPLANT
STAPLER VISISTAT 35W (STAPLE) ×3 IMPLANT
SUCTION POOLE HANDLE (INSTRUMENTS) ×3
SUT PDS AB 1 CTX 36 (SUTURE) ×6 IMPLANT
SUT PDS AB 1 TP1 96 (SUTURE) IMPLANT
SUT PDS AB 3-0 SH 27 (SUTURE) ×3 IMPLANT
SUT PDS AB 4-0 SH 27 (SUTURE) IMPLANT
SUT PROLENE 2 0 BLUE (SUTURE) IMPLANT
SUT SILK 2 0 (SUTURE) ×4
SUT SILK 2 0 SH (SUTURE) ×6 IMPLANT
SUT SILK 2 0 SH CR/8 (SUTURE) ×6 IMPLANT
SUT SILK 2 0SH CR/8 30 (SUTURE) IMPLANT
SUT SILK 2-0 18XBRD TIE 12 (SUTURE) ×2 IMPLANT
SUT SILK 2-0 30XBRD TIE 12 (SUTURE) IMPLANT
SUT SILK 3 0 (SUTURE) ×4
SUT SILK 3 0 SH CR/8 (SUTURE) ×6 IMPLANT
SUT SILK 3-0 18XBRD TIE 12 (SUTURE) ×2 IMPLANT
TOWEL OR 17X26 10 PK STRL BLUE (TOWEL DISPOSABLE) ×6 IMPLANT
TRAY FOLEY W/METER SILVER 16FR (SET/KITS/TRAYS/PACK) ×3 IMPLANT
YANKAUER SUCT BULB TIP NO VENT (SUCTIONS) ×3 IMPLANT

## 2017-05-14 NOTE — H&P (Signed)
LEONARD HENDLER [birth year]  [id number].    Chief Complaint/Reason for Consult: splenic hematoma  HPI:  This is a 57yo WF with a history of HTN who underwent a lap appy for perforated appendicitis by Dr. Johney Maine on 04-12-17.  She did well and went home.  She returned about a week later secondary to LUQ abdominal pain.  She was scanned and found to have a subcapsular splenic hematoma.  She was admitted and transfused 2 units of pRBCs, put on bedrest, and hydrated.  After 5 days, her pain was controlled and her activity had been increased.  She remained stable and was good for DC home.  She saw Dr. Johney Maine for follow up last week in the office and was still doing well.  She was given permission to start going for walks and slow increase her activity.  Yesterday, the patient began to notice that her LUQ abdominal pain was beginning to get worse again.  Her BP was starting to lower at home as well even more so than what her normal oral antihypertensives do.  She states was given a hug by a student 2 days ago and it hurt, but no other trauma to her abdomen.  She has been eating well with no nausea or vomiting, and has been moving her bowels well as well.  She came back in to the ED today secondary to worsening pain.  She had another scan and was found to have an enlarging subcapsular splenic hematoma that was approximately 11x9cm in size with no active extravasation.  We have been asked to see her for admission.  ROS: ROS: Please see HPI, otherwise all other systems are negative.  Family History  Adopted: Yes    Past Medical History:  Diagnosis Date  . Anxiety    EFFEXOR-DR. FRED WILSON  . Cervical dysplasia   . Elevated cholesterol   . Endometriosis   . Migraine with aura 12/2006   COULD NOT SPEAK, pt has aura without migraines  . Ovarian cyst     Past Surgical History:  Procedure Laterality Date  . ABDOMINAL SURGERY  2002   EXPLORATORY LAPAROTOMY, LSO , RIGHT OV CYSTECTOMY  .  BRONCHIAL CLEFT CYST    . COLPOSCOPY    . GYNECOLOGIC CRYOSURGERY    . LAPAROSCOPIC APPENDECTOMY N/A 04/12/2017   Procedure: APPENDECTOMY LAPAROSCOPIC;  Surgeon: Michael Boston, MD;  Location: [facility];  Service: General;  Laterality: N/A;  . OOPHORECTOMY  2002   LSO, R OVARIAN CYSTECTOMY, EXPLOR LAPAROT  . PELVIC LAPAROSCOPY    . TYMPANOSTOMY TUBE PLACEMENT      Social History:  reports that she has been smoking cigarettes.  She has been smoking about 0.50 packs per day. She has never used smokeless tobacco. She reports that she drinks about 2.5 oz of alcohol per week. She reports that she does not use drugs.  Allergies:  Allergies  Allergen Reactions  . Morphine And Related Itching     (Not in a hospital admission)   Physical Exam: Blood pressure 105/72, pulse 77, temperature 97.8 F (36.6 C), temperature source Oral, resp. rate 18, last menstrual period 03/29/2013, SpO2 95 %. General: pleasant, WD, WN white female who is laying in bed in NAD HEENT: head is normocephalic, atraumatic.  Sclera are noninjected.  PERRL.  Ears and nose without any masses or lesions.  Mouth is pink and moist Heart: regular, rate, and rhythm.  Normal s1,s2. No obvious murmurs, gallops, or rubs noted.  Palpable radial and  pedal pulses bilaterally Lungs: CTAB, no wheezes, rhonchi, or rales noted.  Respiratory effort nonlabored Abd: soft, mildly tender in LUQ, but has pain meds on board, ND, +BS, no masses, hernias, or organomegaly MS: all 4 extremities are symmetrical with no cyanosis, clubbing, or edema. Skin: warm and dry with no masses, lesions, or rashes Psych: A&Ox3 with an appropriate affect.   Results for orders placed or performed during the hospital encounter of 05/14/17 (from the past 48 hour(s))  Comprehensive metabolic panel     Status: Abnormal   Collection Time: 05/14/17  4:35 AM  Result Value Ref Range   Sodium 137 135 - 145 mmol/L   Potassium 3.9 3.5 - 5.1 mmol/L   Chloride 99 (L) 101 -  111 mmol/L   CO2 26 22 - 32 mmol/L   Glucose, Bld 97 65 - 99 mg/dL   BUN 13 6 - 20 mg/dL   Creatinine, Ser 0.70 0.44 - 1.00 mg/dL   Calcium 9.5 8.9 - 10.3 mg/dL   Total Protein 7.4 6.5 - 8.1 g/dL   Albumin 3.4 (L) 3.5 - 5.0 g/dL   AST 29 15 - 41 U/L   ALT 33 14 - 54 U/L   Alkaline Phosphatase 143 (H) 38 - 126 U/L   Total Bilirubin 0.5 0.3 - 1.2 mg/dL   GFR calc non Af Amer >60 >60 mL/min   GFR calc Af Amer >60 >60 mL/min    Comment: (NOTE) The eGFR has been calculated using the CKD EPI equation. This calculation has not been validated in all clinical situations. eGFR's persistently <60 mL/min signify possible Chronic Kidney Disease.    Anion gap 12 5 - 15    Comment: Performed at [facility], [place] [address], [place], [place] [address]  CBC with Differential     Status: Abnormal   Collection Time: 05/14/17  4:35 AM  Result Value Ref Range   WBC 10.0 4.0 - 10.5 K/uL   RBC 3.51 (L) 3.87 - 5.11 MIL/uL   Hemoglobin 10.6 (L) 12.0 - 15.0 g/dL   HCT 32.9 (L) 36.0 - 46.0 %   MCV 93.7 78.0 - 100.0 fL   MCH 30.2 26.0 - 34.0 pg   MCHC 32.2 30.0 - 36.0 g/dL   RDW 16.2 (H) 11.5 - 15.5 %   Platelets 563 (H) 150 - 400 K/uL   Neutrophils Relative % 57 %   Neutro Abs 5.7 1.7 - 7.7 K/uL   Lymphocytes Relative 31 %   Lymphs Abs 3.1 0.7 - 4.0 K/uL   Monocytes Relative 9 %   Monocytes Absolute 0.9 0.1 - 1.0 K/uL   Eosinophils Relative 2 %   Eosinophils Absolute 0.2 0.0 - 0.7 K/uL   Basophils Relative 1 %   Basophils Absolute 0.1 0.0 - 0.1 K/uL    Comment: Performed at [facility], [place] [address], [place], [place] [address]  Lipase, blood     Status: None   Collection Time: 05/14/17  4:35 AM  Result Value Ref Range   Lipase 28 11 - 51 U/L    Comment: Performed at [facility], [place] [address] [place], [place] [address]   Ct Abdomen Pelvis W Contrast  Result Date: 05/14/2017 CLINICAL DATA:  Left upper quadrant abdominal pain.  EXAM: CT ABDOMEN AND PELVIS WITH CONTRAST TECHNIQUE: Multidetector CT imaging of the abdomen and pelvis was performed using the standard protocol following bolus administration of intravenous contrast. CONTRAST:  18m ISOVUE-300 IOPAMIDOL (ISOVUE-300) INJECTION 61%  COMPARISON:  CT abdomen pelvis 04/19/2017 FINDINGS: LOWER CHEST: Bibasilar atelectasis. HEPATOBILIARY: Normal hepatic contours and density. No intra- or extrahepatic biliary dilatation. Normal gallbladder. PANCREAS: Normal parenchymal contours without ductal dilatation. No peripancreatic fluid collection. SPLEEN: Subcapsular splenic hematoma has increased in size, now measuring 11.0 x 9.2 cm. The hematoma is predominantly hypodense at this point. The spleen is deformed and compressed against the medial aspect of the splenic capsule. ADRENALS/URINARY TRACT: --Adrenal glands: Unchanged intermediate attenuation left adrenal nodule. --Right kidney/ureter: No hydronephrosis, nephroureterolithiasis, perinephric stranding or solid renal mass. --Left kidney/ureter: No hydronephrosis, nephroureterolithiasis, perinephric stranding or solid renal mass. --Urinary bladder: Normal for degree of distention STOMACH/BOWEL: --Stomach/Duodenum: No hiatal hernia or other gastric abnormality. Normal duodenal course. --Small bowel: No dilatation or inflammation. --Colon: No focal abnormality. --Appendix: Surgically absent. VASCULAR/LYMPHATIC: Atherosclerotic calcification is present within the non-aneurysmal abdominal aorta, without hemodynamically significant stenosis. The portal vein, splenic vein, superior mesenteric vein and IVC are patent. No abdominal or pelvic lymphadenopathy. REPRODUCTIVE: Normal uterus and ovaries. MUSCULOSKELETAL. Multilevel degenerative disc disease and facet arthrosis. No bony spinal canal stenosis. OTHER: None. IMPRESSION: 1. Increased size of subacute subcapsular splenic hematoma, which deforms and compresses the splenic parenchyma against the  medial wall of the splenic capsule. 2. Unchanged appearance of left adrenal nodule, which is indeterminate on this study. Dedicated adrenal mass protocol CT or abdominal MRI is recommended on a nonemergent basis for better characterization. 3.  Aortic Atherosclerosis (ICD10-I70.0). Electronically Signed   By: Ulyses Jarred M.D.   On: 05/14/2017 06:00      Assessment/Plan Subcapsular splenic hematoma, unknown etiology This is the second presentation for a worsening splenic hematoma of unknown etiology.  Her hematoma has now increased in size.  Dr. Ninfa Linden has reviewed her imaging and has discussed this case with oncall trauma surgeon, Dr. Hulen Skains, and as well Dr. Donne Hazel.  After review of her imaging and discussion it was felt the best approach given recurrent hemorrhage is to proceed with a splenectomy.  Dr.Blackman is going to discuss this with the patient and make sure she is agreeable to proceed.  If she is not, then we can consider prophylactic splenic embolization by IR.    The patient will remain NPO.  She will be type and screened in preparation for surgical intervention.  HTN - hold home meds for now due to lower BP Anemia - on Fe at home.  Will T&S in case needed this admission. hgb stable at 10.  Will likely resume Fe post operatively.   FEN - NPO/IVFs VTE - SCDs ID - Ancef, preop  Henreitta Cea, [facility] Surgery 05/14/2017, 9:10 AM Pager: [phone]

## 2017-05-14 NOTE — ED Triage Notes (Signed)
Patient here from home with complaints of left sided abdominal pain. Reports she feels the same as "last time". States that she was told she had a bruised spleen.

## 2017-05-14 NOTE — Anesthesia Postprocedure Evaluation (Signed)
Anesthesia Post Note  Patient: Adrienne [name]  Procedure(s) Performed: SPLENECTOMY (N/A )     Patient location during evaluation: PACU Anesthesia Type: General Level of consciousness: awake and alert Pain management: pain level controlled Vital Signs Assessment: post-procedure vital signs reviewed and stable Respiratory status: spontaneous breathing, nonlabored ventilation and respiratory function stable Cardiovascular status: blood pressure returned to baseline and stable Postop Assessment: no apparent nausea or vomiting Anesthetic complications: no    Last Vitals:  Vitals:   05/14/17 1330 05/14/17 1355  BP: (!) 133/91 116/78  Pulse: 81 77  Resp: 16 15  Temp: 36.8 C 36.6 C  SpO2: 95% 92%    Last Pain:  Vitals:   05/14/17 1400  TempSrc:   PainSc: 5                  Beryle Lathehomas E Damen [place]

## 2017-05-14 NOTE — ED Provider Notes (Signed)
[facility]-EMERGENCY DEPT Provider Note   CSN: [id number] Arrival date & time: 05/14/17  0353     History   Chief Complaint Chief Complaint  Patient presents with  . Abdominal Pain    HPI CARSYNN BETHUNE is a 57 y.o. female.  The history is provided by the patient and medical records. No language interpreter was used.  Abdominal Pain   Pertinent negatives include diarrhea, nausea, vomiting and constipation.   LEZLI DANEK is a 57 y.o. female  who presents to the Emergency Department complaining of acute onset of left upper quadrant pain which began today around 1:30 AM.  She reports pain feels similar to pain she experienced with recent splenic hematoma about 3-4 weeks ago.  No medications taken prior to arrival for symptoms.  Denies alleviating or aggravating factors.  No fever, chills, nausea, vomiting, back pain, urinary symptoms, diarrhea, constipation or blood in the stools.  Patient diagnosed with acute appendicitis on 3/01.  She underwent laparoscopic appendectomy and was discharged home on 3/03.  She presented to the emergency department once again on 3/09 where CT shows evidence of a splenic injury with hematoma and small amount of hemoperitoneum thought to be spontaneous.  She was admitted to surgery service. She also underwent blood transfusion for drop in hemoglobin on 3/09.  Per chart review, it appears that hematoma of the spleen was stable and that she did not undergo any surgeries during last admission.  Past Medical History:  Diagnosis Date  . Anxiety    EFFEXOR-DR. FRED WILSON  . Cervical dysplasia   . Elevated cholesterol   . Endometriosis   . Migraine with aura 12/2006   COULD NOT SPEAK, pt has aura without migraines  . Ovarian cyst     Patient Active Problem List   Diagnosis Date Noted  . 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 Perforated & gangrenous appendicitis s/p lap appendectomy 04/12/2017 04/12/2017  .  Tobacco abuse 04/12/2017  . Depression 09/21/2015  . Essential hypertension 07/15/2012  . Cervical dysplasia   . Ovarian cyst   . Endometriosis   . Atypical migraine   . Anxiety     Past Surgical History:  Procedure Laterality Date  . ABDOMINAL SURGERY  2002   EXPLORATORY LAPAROTOMY, LSO , RIGHT OV CYSTECTOMY  . BRONCHIAL CLEFT CYST    . COLPOSCOPY    . GYNECOLOGIC CRYOSURGERY    . LAPAROSCOPIC APPENDECTOMY N/A 04/12/2017   Procedure: APPENDECTOMY LAPAROSCOPIC;  Surgeon: Karie Soda, MD;  Location: [facility];  Service: General;  Laterality: N/A;  . OOPHORECTOMY  2002   LSO, R OVARIAN CYSTECTOMY, EXPLOR LAPAROT  . PELVIC LAPAROSCOPY    . TYMPANOSTOMY TUBE PLACEMENT       OB History    Gravida  1   Para  0   Term      Preterm      AB  1   Living  0     SAB      TAB      Ectopic      Multiple      Live Births               Home Medications    Prior to Admission medications   Medication Sig Start Date End Date Taking? Authorizing Provider  acetaminophen (TYLENOL) 500 MG tablet You can take two 500 mg tablets every 6 hours for pain.  I would do this around the clock until you are no  longer in need of pain medications.  If you have 325 mg tablets at home you can take 3 tablets every 6 hours for pain.  If plain Tylenol is not adequate relieve your pain, you can use the oxycodone.  Both strengths of Tylenol can be bought over-the-counter at any drugstore.  Do not exceed 4000 mg of Tylenol per day, larger doses can harm you 04/24/17  Yes Sherrie GeorgeJennings, Willard, PA-C  ALPRAZolam Prudy Feeler(XANAX) 0.25 MG tablet Take 0.25-0.5 mg by mouth 3 (three) times daily as needed for anxiety. For anxiety.   Yes [provider]  Ascorbic Acid (VITAMIN C) 1000 MG tablet Take 1,000 mg by mouth daily.   Yes [provider]  carvedilol (COREG) 12.5 MG tablet Take 12.5 mg by mouth 2 (two) times daily.  09/19/16  Yes [provider]  ferrous sulfate 325 (65 FE) MG tablet Take  325 mg by mouth daily with breakfast.   Yes [provider]  hydrochlorothiazide (MICROZIDE) 12.5 MG capsule Take 12.5 mg by mouth daily.   Yes [provider]  methocarbamol (ROBAXIN) 750 MG tablet Take 1 tablet (750 mg total) by mouth every 6 (six) hours as needed for muscle spasms. 04/24/17  Yes Sherrie GeorgeJennings, Willard, PA-C  Multiple Vitamins-Iron (MULTIVITAMINS WITH IRON) TABS tablet You need a multivitamin with iron to help rebuild your blood.  Be sure your multivitamin has iron.  You can buy this over the counter at any drug store. 04/24/17  Yes Sherrie GeorgeJennings, Willard, PA-C  oxyCODONE (OXY IR/ROXICODONE) 5 MG immediate release tablet Take 1-2 tablets (5-10 mg total) by mouth every 4 (four) hours as needed for moderate pain, severe pain or breakthrough pain. 04/24/17  Yes Sherrie GeorgeJennings, Willard, PA-C  venlafaxine XR (EFFEXOR-XR) 75 MG 24 hr capsule Take 150 mg by mouth daily.    Yes [provider]  hydrocortisone-pramoxine [facility](ANALPRAM-HC) 2.5-1 % rectal cream Place rectally 3 (three) times daily. Patient not taking: Reported on 04/11/2017 07/15/12   Harrington ChallengerYoung, Nancy J, NP    Family History Family History  Adopted: Yes    Social History Social History   Tobacco Use  . Smoking status: Current Every Day Smoker    Packs/day: 0.50    Types: Cigarettes  . Smokeless tobacco: Never Used  Substance Use Topics  . Alcohol use: Yes    Alcohol/week: 2.5 oz    Types: 5 Standard drinks or equivalent per week  . Drug use: No     Allergies   Morphine and related   Review of Systems Review of Systems  Gastrointestinal: Positive for abdominal pain. Negative for blood in stool, constipation, diarrhea, nausea and vomiting.  All other systems reviewed and are negative.    Physical Exam Updated Vital Signs BP 104/79   Pulse 80   Temp 97.8 F (36.6 C) (Oral)   Resp 16   LMP 03/29/2013 Comment: perimenopausal  SpO2 97%   Physical Exam  Constitutional: She is oriented to person,  place, and time. She appears well-developed and well-nourished. No distress.  HENT:  Head: Normocephalic and atraumatic.  Cardiovascular: Normal rate, regular rhythm and normal heart sounds.  No murmur heard. Pulmonary/Chest: Effort normal and breath sounds normal. No respiratory distress.  Abdominal: Soft. She exhibits no distension. There is tenderness in the left upper quadrant.  Musculoskeletal: She exhibits no edema.  Neurological: She is alert and oriented to person, place, and time.  Skin: Skin is warm and dry.  Nursing note and vitals reviewed.    ED Treatments / Results  Labs (all labs ordered are listed, but only abnormal results are displayed) Labs Reviewed  COMPREHENSIVE METABOLIC PANEL - Abnormal; Notable for the following components:      Result Value   Chloride 99 (*)    Albumin 3.4 (*)    Alkaline Phosphatase 143 (*)    All other components within normal limits  CBC WITH DIFFERENTIAL/PLATELET - Abnormal; Notable for the following components:   RBC 3.51 (*)    Hemoglobin 10.6 (*)    HCT 32.9 (*)    RDW 16.2 (*)    Platelets 563 (*)    All other components within normal limits  LIPASE, BLOOD    EKG None  Radiology Ct Abdomen Pelvis W Contrast  Result Date: 05/14/2017 CLINICAL DATA:  Left upper quadrant abdominal pain. EXAM: CT ABDOMEN AND PELVIS WITH CONTRAST TECHNIQUE: Multidetector CT imaging of the abdomen and pelvis was performed using the standard protocol following bolus administration of intravenous contrast. CONTRAST:  ISOVUE-300 IOPAMIDOL (ISOVUE-300) INJECTION 61% COMPARISON:  CT abdomen pelvis 04/19/2017 FINDINGS: LOWER CHEST: Bibasilar atelectasis. HEPATOBILIARY: Normal hepatic contours and density. No intra- or extrahepatic biliary dilatation. Normal gallbladder. PANCREAS: Normal parenchymal contours without ductal dilatation. No peripancreatic fluid collection. SPLEEN: Subcapsular splenic hematoma has increased in size, now measuring 11.0 x 9.2  cm. The hematoma is predominantly hypodense at this point. The spleen is deformed and compressed against the medial aspect of the splenic capsule. ADRENALS/URINARY TRACT: --Adrenal glands: Unchanged intermediate attenuation left adrenal nodule. --Right kidney/ureter: No hydronephrosis, nephroureterolithiasis, perinephric stranding or solid renal mass. --Left kidney/ureter: No hydronephrosis, nephroureterolithiasis, perinephric stranding or solid renal mass. --Urinary bladder: Normal for degree of distention STOMACH/BOWEL: --Stomach/Duodenum: No hiatal hernia or other gastric abnormality. Normal duodenal course. --Small bowel: No dilatation or inflammation. --Colon: No focal abnormality. --Appendix: Surgically absent. VASCULAR/LYMPHATIC: Atherosclerotic calcification is present within the non-aneurysmal abdominal aorta, without hemodynamically significant stenosis. The portal vein, splenic vein, superior mesenteric vein and IVC are patent. No abdominal or pelvic lymphadenopathy. REPRODUCTIVE: Normal uterus and ovaries. MUSCULOSKELETAL. Multilevel degenerative disc disease and facet arthrosis. No bony spinal canal stenosis. OTHER: None. IMPRESSION: 1. Increased size of subacute subcapsular splenic hematoma, which deforms and compresses the splenic parenchyma against the medial wall of the splenic capsule. 2. Unchanged appearance of left adrenal nodule, which is indeterminate on this study. Dedicated adrenal mass protocol CT or abdominal MRI is recommended on a nonemergent basis for better characterization. 3.  Aortic Atherosclerosis (ICD10-I70.0). Electronically Signed   By: Deatra Robinson M.D.   On: 05/14/2017 06:00    Procedures Procedures (including critical care time)  Medications Ordered in ED Medications  iopamidol (ISOVUE-300) 61 % injection (has no administration in time range)  sodium chloride 0.9 % bolus 1,000 mL (1,000 mLs Intravenous New Bag/Given 05/14/17 0655)  sodium chloride 0.9 % bolus 1,000 mL  (0 mLs Intravenous Stopped 05/14/17 0544)  fentaNYL (SUBLIMAZE) injection 50 mcg (50 mcg Intravenous Given 05/14/17 0439)  iopamidol (ISOVUE-300) 61 % injection 100 mL (100 mLs Intravenous Contrast Given 05/14/17 0530)  HYDROmorphone (DILAUDID) injection 0.5 mg (0.5 mg Intravenous Given 05/14/17 0620)     Initial Impression / Assessment and Plan / ED Course  I have reviewed the triage vital signs and the nursing notes.  Pertinent labs & imaging results that were available during my care of the patient were reviewed by me and considered in my medical decision making (see chart for details).    OVETTA BAZZANO is a 57 y.o. female who presents to ED for acute  onset of left upper quadrant pain similar to pain she experienced last month. Per chart review, patient was admitted from 3/09-3/14 for acute splenic hematoma. Hypotensive 94/65 upon arrival. Fluids initiated and BP responding appropriately with improvement to 103/72. No tachycardia. Tender to palpation of LUQ. Will obtain labs and repeat CT for further evaluation.   Labs reviewed and reassuring. Hgb of 10.6 (Had 2U blood transfused during previous admission when hgb dropped to 8). Normal creatinine. CT reviewed showing an increase in the size of subacute splenic hematoma.   6:17 AM - Spoke with general surgery, Dr. Andrey Campanile. General surgery to evaluate patient after 7am.   Care assumed by oncoming provider PA Harris who will follow up on general surgery recommendations. Anticipate admission.   Patient discussed with Dr. Judd Lien who agrees with treatment plan.   Final Clinical Impressions(s) / ED Diagnoses   Final diagnoses:  Splenic hemorrhage    ED Discharge Orders    None       Ward, Chase Picket, PA-C 05/14/17 1610    Geoffery Lyons, MD 05/21/17 [phone]

## 2017-05-14 NOTE — Transfer of Care (Signed)
Immediate Anesthesia Transfer of Care Note  Patient: Adrienne [name]  Procedure(s) Performed: SPLENECTOMY (N/A )  Patient Location: PACU  Anesthesia Type:General  Level of Consciousness: awake, alert  and oriented  Airway & Oxygen Therapy: Patient Spontanous Breathing and Patient connected to face mask oxygen  Post-op Assessment: Report given to RN and Post -op Vital signs reviewed and stable  Post vital signs: Reviewed and stable  Last Vitals:  Vitals Value Taken Time  BP 135/84 05/14/2017 12:36 PM  Temp    Pulse 93 05/14/2017 12:39 PM  Resp 22 05/14/2017 12:39 PM  SpO2 99 % 05/14/2017 12:39 PM  Vitals shown include unvalidated device data.  Last Pain:  Vitals:   05/14/17 0655  TempSrc:   PainSc: 7          Complications: No apparent anesthesia complications

## 2017-05-14 NOTE — Anesthesia Procedure Notes (Signed)
Procedure Name: Intubation Date/Time: 05/14/2017 11:19 AM Performed by: Maxwell Caul, CRNA Pre-anesthesia Checklist: Patient identified, Emergency Drugs available, Suction available and Patient being monitored Patient Re-evaluated:Patient Re-evaluated prior to induction Oxygen Delivery Method: Circle system utilized Preoxygenation: Pre-oxygenation with 100% oxygen Induction Type: IV induction Ventilation: Mask ventilation without difficulty Laryngoscope Size: Mac and 4 Grade View: Grade I Tube type: Oral Tube size: 7.5 mm Number of attempts: 1 Airway Equipment and Method: Stylet Placement Confirmation: ETT inserted through vocal cords under direct vision,  positive ETCO2 and breath sounds checked- equal and bilateral Secured at: 21 cm Tube secured with: Tape Dental Injury: Teeth and Oropharynx as per pre-operative assessment

## 2017-05-14 NOTE — Anesthesia Preprocedure Evaluation (Addendum)
Anesthesia Evaluation  Patient identified by MRN, date of birth, ID band Patient awake    Reviewed: Allergy & Precautions, NPO status , Patient's Chart, lab work & pertinent test results  Airway Mallampati: II  TM Distance: >3 FB Neck ROM: Full    Dental no notable dental hx. (+) Dental Advisory Given, Teeth Intact   Pulmonary Current Smoker,    Pulmonary exam normal breath sounds clear to auscultation       Cardiovascular hypertension, Pt. on medications and Pt. on home beta blockers Normal cardiovascular exam Rhythm:Regular Rate:Normal     Neuro/Psych  Headaches, PSYCHIATRIC DISORDERS Anxiety Depression    GI/Hepatic negative GI ROS, Neg liver ROS,   Endo/Other  negative endocrine ROS  Renal/GU negative Renal ROS     Musculoskeletal   Abdominal   Peds  Hematology HLD   Anesthesia Other Findings Appendicitis  Reproductive/Obstetrics                            Anesthesia Physical  Anesthesia Plan  ASA: III  Anesthesia Plan: General   Post-op Pain Management:    Induction: Intravenous  PONV Risk Score and Plan: 2 and Ondansetron, Dexamethasone, Midazolam and Treatment may vary due to age or medical condition  Airway Management Planned: Oral ETT  Additional Equipment: None  Intra-op Plan:   Post-operative Plan: Extubation in OR  Informed Consent: I have reviewed the patients History and Physical, chart, labs and discussed the procedure including the risks, benefits and alternatives for the proposed anesthesia with the patient or authorized representative who has indicated his/her understanding and acceptance.   Dental advisory given  Plan Discussed with: CRNA and Anesthesiologist  Anesthesia Plan Comments: (2 IVs)        Anesthesia Quick Evaluation

## 2017-05-14 NOTE — Op Note (Signed)
SPLENECTOMY  Procedure Note  MARGUERITA STAPP 05/14/2017   Pre-op Diagnosis: splenic hematoma     Post-op Diagnosis: same  Procedure(s): SPLENECTOMY  Surgeon(s): Abigail Miyamoto, MD Karie Soda, MD  Anesthesia: General  Staff:  Circulator: Dominga Ferry, RN Physician Assistant: Adam Phenix, PA-C Scrub Person: Ouida Sills, April C, [place]; Bertram Denver, RN Circulator Assistant: Bolivar Haw, RN  Estimated Blood Loss: Minimal               Specimens: sent to path  Indications: This is a 57 year old female who is one-month status post laparoscopic appendectomy.  This was performed on March 2.  She presented back to the hospital on March 9 with left upper quadrant abdominal pain and was found to have a splenic hematoma.  She required transfusion of 2 units of blood and stabilized and was eventually discharged home.  She returned today with worsening left upper quadrant abdominal pain and was found to have an increase in size of the large splenic hematoma.  The decision was made to proceed to the operating room for splenectomy after discussion with the patient and her husband  Findings: The patient was found to have a large, contained splenic hematoma in the left upper quadrant which was adherent to the diaphragm in the abdominal sidewall.  Procedure: The patient was brought to the operating room and identified as the correct patient.  She was placed supine on the operating table and general anesthesia was induced.  Her abdomen was then prepped and draped in usual sterile fashion.  I created an upper midline incision with a scalpel.  I took this down through the subtenons tissue and fascia with electrocautery.  The peritoneum was then opened the entire length of the incision.  Upon entering the abdomen, no free fluid was identified.  The spleen could not be visualized initially in the left upper quadrant.  The hematoma was covered with omentum and fixated  into the left upper quadrant.  We had to use the Bookwalter retractor to retract the abdominal wall as well as her large left lobe of liver.  I was then able to finger fracture of the hematoma and spleen off of the abdominal sidewall and diaphragm.  Old hematoma came out of the capsule of the spleen.  I was finally able to elevate the rest of the remaining splenic tissue and capsule.  We took down the short gastrics with the LigaSure device.  I then clamped across what appeared to be the hilum with Kelly clamps and removed the rest of the splenic tissue.  This was sent to pathology for evaluation.  We suture ligated the pedicle as well as used the LigaSure to achieve hemostasis.  Several short gastric bleeding vessels were controlled with 2-0 silk sutures as well.  I thoroughly irrigated the abdomen and left upper quadrant with several liters of saline.  There was minimal overall ileus from the surface of the diaphragm.  I placed a couple piece of surgical  snow into the left upper quadrant.  We again evaluate the left upper quadrant and hemostasis appeared to be achieved.  The tail the pancreas appeared intact as well as the stomach and splenic flexure of the colon.  At this point the patient's midline incision was closed with a running #1 PDS suture.  The skin was then irrigated and closed with skin staples.  The patient tolerated procedure well.  All the counts were correct at the end of the procedure.  The patient was then extubated in the operating room and taken in a stable condition to the recovery room.          Onika Gudiel A   Date: 05/14/2017  Time: 12:37 PM

## 2017-05-15 ENCOUNTER — Encounter (HOSPITAL_COMMUNITY): Payer: Self-pay | Admitting: Surgery

## 2017-05-15 LAB — CBC
HEMATOCRIT: 29.7 % — AB (ref 36.0–46.0)
HEMOGLOBIN: 9.6 g/dL — AB (ref 12.0–15.0)
MCH: 30.9 pg (ref 26.0–34.0)
MCHC: 32.3 g/dL (ref 30.0–36.0)
MCV: 95.5 fL (ref 78.0–100.0)
PLATELETS: 525 10*3/uL — AB (ref 150–400)
RBC: 3.11 MIL/uL — AB (ref 3.87–5.11)
RDW: 16.2 % — ABNORMAL HIGH (ref 11.5–15.5)
WBC: 19.6 10*3/uL — AB (ref 4.0–10.5)

## 2017-05-15 LAB — COMPREHENSIVE METABOLIC PANEL
ALT: 58 U/L — AB (ref 14–54)
ANION GAP: 7 (ref 5–15)
AST: 51 U/L — ABNORMAL HIGH (ref 15–41)
Albumin: 3 g/dL — ABNORMAL LOW (ref 3.5–5.0)
Alkaline Phosphatase: 105 U/L (ref 38–126)
BUN: 9 mg/dL (ref 6–20)
CHLORIDE: 103 mmol/L (ref 101–111)
CO2: 26 mmol/L (ref 22–32)
CREATININE: 0.59 mg/dL (ref 0.44–1.00)
Calcium: 8.9 mg/dL (ref 8.9–10.3)
Glucose, Bld: 140 mg/dL — ABNORMAL HIGH (ref 65–99)
POTASSIUM: 4.2 mmol/L (ref 3.5–5.1)
SODIUM: 136 mmol/L (ref 135–145)
Total Bilirubin: 0.3 mg/dL (ref 0.3–1.2)
Total Protein: 6.4 g/dL — ABNORMAL LOW (ref 6.5–8.1)

## 2017-05-15 MED ORDER — ENOXAPARIN SODIUM 40 MG/0.4ML ~~LOC~~ SOLN
40.0000 mg | Freq: Every day | SUBCUTANEOUS | Status: DC
  Administered 2017-05-15 – 2017-05-16 (×2): 40 mg via SUBCUTANEOUS
  Filled 2017-05-15 (×2): qty 0.4

## 2017-05-15 MED ORDER — METHOCARBAMOL 500 MG PO TABS
750.0000 mg | ORAL_TABLET | Freq: Three times a day (TID) | ORAL | Status: DC | PRN
  Administered 2017-05-15 – 2017-05-16 (×4): 750 mg via ORAL
  Filled 2017-05-15 (×4): qty 2

## 2017-05-15 MED ORDER — KETOROLAC TROMETHAMINE 30 MG/ML IJ SOLN
15.0000 mg | Freq: Three times a day (TID) | INTRAMUSCULAR | Status: DC | PRN
  Administered 2017-05-15: 15 mg via INTRAVENOUS
  Filled 2017-05-15: qty 1

## 2017-05-15 MED ORDER — HYDROCHLOROTHIAZIDE 12.5 MG PO CAPS
12.5000 mg | ORAL_CAPSULE | Freq: Every day | ORAL | Status: DC
  Administered 2017-05-15: 12.5 mg via ORAL
  Filled 2017-05-15 (×2): qty 1

## 2017-05-15 MED ORDER — CARVEDILOL 12.5 MG PO TABS
12.5000 mg | ORAL_TABLET | Freq: Two times a day (BID) | ORAL | Status: DC
  Administered 2017-05-15 (×2): 12.5 mg via ORAL
  Filled 2017-05-15 (×3): qty 1

## 2017-05-15 MED ORDER — KETOROLAC TROMETHAMINE 30 MG/ML IJ SOLN
30.0000 mg | Freq: Three times a day (TID) | INTRAMUSCULAR | Status: AC
  Administered 2017-05-15 – 2017-05-16 (×3): 30 mg via INTRAVENOUS
  Filled 2017-05-15 (×3): qty 1

## 2017-05-15 NOTE — Progress Notes (Signed)
1 Day Post-Op  Subjective: Patient had some anxiety and pain overnight.  PCA got locked out at one point and difficult to control until now that it is working better.  Having pain as expected. Some muscle cramping  Objective: Vital signs in last 24 hours: Temp:  [97.7 F (36.5 C)-98.6 F (37 C)] 97.7 F (36.5 C) (04/04 0605) Pulse Rate:  [72-94] 91 (04/04 0605) Resp:  [13-33] 20 (04/04 0605) BP: (114-137)/(66-91) 134/91 (04/04 0605) SpO2:  [90 %-99 %] 98 % (04/04 0605) Last BM Date: 05/13/17  Intake/Output from previous day: 04/03 0701 - 04/04 0700 In: 3477.1 [I.V.:2127.1; IV Piggyback:1350] Out: 1130 [Urine:1030; Blood:100] Intake/Output this shift: Total I/O In: 0  Out: 250 [Urine:250]  PE: Heart: regular Lungs: CTAB Abd: soft, appropriately tender, great BS, ND, midline incision is c/d/i with staples and honeycomb dressing in place. GU foley in place with clear yellow urine  Lab Results:  Recent Labs    05/14/17 0435  WBC 10.0  HGB 10.6*  HCT 32.9*  PLT 563*   BMET Recent Labs    05/14/17 0435 05/15/17 0459  NA 137 136  K 3.9 4.2  CL 99* 103  CO2 26 26  GLUCOSE 97 140*  BUN 13 9  CREATININE 0.70 0.59  CALCIUM 9.5 8.9   PT/INR No results for input(s): LABPROT, INR in the last 72 hours. CMP     Component Value Date/Time   NA 136 05/15/2017 0459   K 4.2 05/15/2017 0459   CL 103 05/15/2017 0459   CO2 26 05/15/2017 0459   GLUCOSE 140 (H) 05/15/2017 0459   BUN 9 05/15/2017 0459   CREATININE 0.59 05/15/2017 0459   CALCIUM 8.9 05/15/2017 0459   PROT 6.4 (L) 05/15/2017 0459   ALBUMIN 3.0 (L) 05/15/2017 0459   AST 51 (H) 05/15/2017 0459   ALT 58 (H) 05/15/2017 0459   ALKPHOS 105 05/15/2017 0459   BILITOT 0.3 05/15/2017 0459   GFRNONAA >60 05/15/2017 0459   GFRAA >60 05/15/2017 0459   Lipase     Component Value Date/Time   LIPASE 28 05/14/2017 0435       Studies/Results: Ct Abdomen Pelvis W Contrast  Result Date: 05/14/2017 CLINICAL  DATA:  Left upper quadrant abdominal pain. EXAM: CT ABDOMEN AND PELVIS WITH CONTRAST TECHNIQUE: Multidetector CT imaging of the abdomen and pelvis was performed using the standard protocol following bolus administration of intravenous contrast. CONTRAST:  ISOVUE-300 IOPAMIDOL (ISOVUE-300) INJECTION 61% COMPARISON:  CT abdomen pelvis 04/19/2017 FINDINGS: LOWER CHEST: Bibasilar atelectasis. HEPATOBILIARY: Normal hepatic contours and density. No intra- or extrahepatic biliary dilatation. Normal gallbladder. PANCREAS: Normal parenchymal contours without ductal dilatation. No peripancreatic fluid collection. SPLEEN: Subcapsular splenic hematoma has increased in size, now measuring 11.0 x 9.2 cm. The hematoma is predominantly hypodense at this point. The spleen is deformed and compressed against the medial aspect of the splenic capsule. ADRENALS/URINARY TRACT: --Adrenal glands: Unchanged intermediate attenuation left adrenal nodule. --Right kidney/ureter: No hydronephrosis, nephroureterolithiasis, perinephric stranding or solid renal mass. --Left kidney/ureter: No hydronephrosis, nephroureterolithiasis, perinephric stranding or solid renal mass. --Urinary bladder: Normal for degree of distention STOMACH/BOWEL: --Stomach/Duodenum: No hiatal hernia or other gastric abnormality. Normal duodenal course. --Small bowel: No dilatation or inflammation. --Colon: No focal abnormality. --Appendix: Surgically absent. VASCULAR/LYMPHATIC: Atherosclerotic calcification is present within the non-aneurysmal abdominal aorta, without hemodynamically significant stenosis. The portal vein, splenic vein, superior mesenteric vein and IVC are patent. No abdominal or pelvic lymphadenopathy. REPRODUCTIVE: Normal uterus and ovaries. MUSCULOSKELETAL. Multilevel degenerative disc  disease and facet arthrosis. No bony spinal canal stenosis. OTHER: None. IMPRESSION: 1. Increased size of subacute subcapsular splenic hematoma, which deforms and  compresses the splenic parenchyma against the medial wall of the splenic capsule. 2. Unchanged appearance of left adrenal nodule, which is indeterminate on this study. Dedicated adrenal mass protocol CT or abdominal MRI is recommended on a nonemergent basis for better characterization. 3.  Aortic Atherosclerosis (ICD10-I70.0). Electronically Signed   By: Deatra RobinsonKevin  Herman M.D.   On: 05/14/2017 06:00   Dg Chest Port 1 View  Result Date: 05/14/2017 CLINICAL DATA:  Preoperative evaluation for splenic surgery EXAM: PORTABLE CHEST 1 VIEW COMPARISON:  Portable exam 0952 hours without priors for comparison FINDINGS: Normal heart size, mediastinal contours, and pulmonary vascularity. Mild LEFT basilar atelectasis. Lungs otherwise clear. No acute infiltrate, pleural effusion or pneumothorax. Bones unremarkable. IMPRESSION: LEFT basilar atelectasis. Electronically Signed   By: Ulyses SouthwardMark  Boles M.D.   On: 05/14/2017 10:13    Anti-infectives: Anti-infectives (From admission, onward)   Start     Dose/Rate Route Frequency Ordered Stop   05/14/17 1000  ceFAZolin (ANCEF) IVPB 2g/100 mL premix     2 g 200 mL/hr over 30 Minutes Intravenous On call to O.R. 05/14/17 0950 05/14/17 1142       Assessment/Plan  Subcapsular splenic hematoma, unknown etiology, POD 1, s/p ex lap with splenectomy -good bowel activity, will advance to clear liquids -DC foley today -mobilize TID -add robaxin for muscle spasms  -continue PCA today while on clear liquids and transition to oral pain medications as diet advances -Toradol prn -check CBC today  Anxiety - cont home dose of Xanax HTN - resume home medications Anemia - ferrous sulfate at home.  Will see what hgb is today  FEN - clear liquids/IVFs VTE - SCDs, will likely add lovenox today based on hgb ID - ancef preop   LOS: 1 day    Letha CapeKelly E Omarius Grantham , [facility]A-C [facility] 05/15/2017, 9:04 AM Pager: [phone]

## 2017-05-16 MED ORDER — OXYCODONE HCL 5 MG PO TABS
5.0000 mg | ORAL_TABLET | Freq: Four times a day (QID) | ORAL | Status: DC | PRN
  Administered 2017-05-16: 5 mg via ORAL
  Filled 2017-05-16: qty 1

## 2017-05-16 MED ORDER — HYDROMORPHONE HCL 1 MG/ML IJ SOLN
0.5000 mg | INTRAMUSCULAR | Status: DC | PRN
  Filled 2017-05-16: qty 1

## 2017-05-16 MED ORDER — OXYCODONE HCL 5 MG PO TABS: 5.0000 mg | ORAL_TABLET | ORAL | 0 refills | Status: DC | PRN

## 2017-05-16 MED ORDER — TRAMADOL HCL 50 MG PO TABS: 50.0000 mg | ORAL_TABLET | Freq: Four times a day (QID) | ORAL | Status: DC | PRN

## 2017-05-16 MED ORDER — IBUPROFEN 200 MG PO TABS: 600.0000 mg | ORAL_TABLET | Freq: Three times a day (TID) | ORAL | 0 refills | Status: DC | PRN

## 2017-05-16 MED ORDER — METHOCARBAMOL 750 MG PO TABS: 750.0000 mg | ORAL_TABLET | Freq: Four times a day (QID) | ORAL | 0 refills | Status: DC | PRN

## 2017-05-16 NOTE — Progress Notes (Signed)
Pt alert and oriented. Tolerating diet. D/C instructions were given and all questions answered. Pt was d/cd home with spouse.

## 2017-05-16 NOTE — Discharge Instructions (Signed)
Vaccines needed 2 weeks after spleen removal: Hib: (Haemophilus influenzae type b)  Pneumococcal: Prevnar 13, PCV13 (first)  Neisseria meningitidis  Vaccine to be given about 8 weeks after first vaccines given: Pneumococcal: Pneumovax 23, PPSV23    [facility]      [facility], [place] [phone]  OPEN ABDOMINAL SURGERY: POST OP INSTRUCTIONS  Always review your discharge instruction sheet given to you by the facility where your surgery was performed.  IF YOU HAVE DISABILITY OR FAMILY LEAVE FORMS, YOU MUST BRING THEM TO THE OFFICE FOR PROCESSING.  PLEASE DO NOT GIVE THEM TO YOUR DOCTOR.  1. A prescription for pain medication may be given to you upon discharge.  Take your pain medication as prescribed, if needed.  If narcotic pain medicine is not needed, then you may take acetaminophen (Tylenol) or ibuprofen (Advil) as needed. 2. Take your usually prescribed medications unless otherwise directed. 3. If you need a refill on your pain medication, please contact your pharmacy. They will contact our office to request authorization.  Prescriptions will not be filled after 5pm or on week-ends. 4. You should follow a light diet the first few days after arrival home, such as soup and crackers, pudding, etc.unless your doctor has advised otherwise. A high-fiber, low fat diet can be resumed as tolerated.   Be sure to include lots of fluids daily. Most patients will experience some swelling and bruising on the chest and neck area.  Ice packs will help.  Swelling and bruising can take several days to resolve 5. Most patients will experience some swelling and bruising in the area of the incision. Ice pack will help. Swelling and bruising can take several days to resolve..  6. It is common to experience some constipation if taking pain medication after surgery.  Increasing fluid intake and taking a stool softener will usually help or prevent this problem from occurring.  A mild laxative (Milk of Magnesia or  Miralax) should be taken according to package directions if there are no bowel movements after 48 hours. 7.  You may have steri-strips (small skin tapes) in place directly over the incision.  These strips should be left on the skin for 7-10 days.  If your surgeon used skin glue on the incision, you may shower in 24 hours.  The glue will flake off over the next 2-3 weeks.  Any sutures or staples will be removed at the office during your follow-up visit. You may find that a light gauze bandage over your incision may keep your staples from being rubbed or pulled. You may shower and replace the bandage daily. 8. ACTIVITIES:  You may resume regular (light) daily activities beginning the next day--such as daily self-care, walking, climbing stairs--gradually increasing activities as tolerated.  You may have sexual intercourse when it is comfortable.  Refrain from any heavy lifting or straining until approved by your doctor. a. You may drive when you no longer are taking prescription pain medication, you can comfortably wear a seatbelt, and you can safely maneuver your car and apply brakes b. Return to Work: ___________________________________ 9. You should see your doctor in the office for a follow-up appointment approximately two weeks after your surgery.  Make sure that you call for this appointment within a day or two after you arrive home to insure a convenient appointment time. OTHER INSTRUCTIONS:  _____________________________________________________________ _____________________________________________________________  WHEN TO CALL YOUR DOCTOR: 1. Fever over 101.0 2. Inability to urinate 3. Nausea and/or vomiting 4. Extreme swelling or bruising 5. Continued  bleeding from incision. 6. Increased pain, redness, or drainage from the incision. 7. Difficulty swallowing or breathing 8. Muscle cramping or spasms. 9. Numbness or tingling in hands or feet or around lips.  The clinic staff is available to  answer your questions during regular business hours.  Please dont hesitate to call and ask to speak to one of the nurses if you have concerns.  For further questions, please visit [email or web address]

## 2017-05-16 NOTE — Discharge Summary (Signed)
Patient ID: Adrienne [name] [id number] 1960-08-24 57 y.o.  Admit date: 05/14/2017 Discharge date: 05/16/2017  Admitting Diagnosis: Subcapsular splenic hematoma  Discharge Diagnosis Patient Active Problem List   Diagnosis Date Noted  . Spleen hematoma s/p splenectomy 05/14/2017 05/14/2017  . Perforated & gangrenous appendicitis s/p lap appendectomy 04/12/2017 04/12/2017  . Tobacco abuse 04/12/2017  . Depression 09/21/2015  . Essential hypertension 07/15/2012  . Cervical dysplasia   . Ovarian cyst   . Endometriosis   . Atypical migraine   . Anxiety     Consultants None  Reason for Admission: This is a 57yo WF with a history of HTN who underwent a lap appy for perforated appendicitis by Dr. Michaell Cowing on 04-12-17.  She did well and went home.  She returned about a week later secondary to LUQ abdominal pain.  She was scanned and found to have a subcapsular splenic hematoma.  She was admitted and transfused 2 units of pRBCs, put on bedrest, and hydrated.  After 5 days, her pain was controlled and her activity had been increased.  She remained stable and was good for DC home.  She saw Dr. Michaell Cowing for follow up last week in the office and was still doing well.  She was given permission to start going for walks and slow increase her activity.  Yesterday, the patient began to notice that her LUQ abdominal pain was beginning to get worse again.  Her BP was starting to lower at home as well even more so than what her normal oral antihypertensives do.  She states was given a hug by a student 2 days ago and it hurt, but no other trauma to her abdomen.  She has been eating well with no nausea or vomiting, and has been moving her bowels well as well.  She came back in to the ED today secondary to worsening pain.  She had another scan and was found to have an enlarging subcapsular splenic hematoma that was approximately 11x9cm in size with no active extravasation.  We have been asked to see her for  admission.  Procedures Laparotomy with splenectomy  Hospital Course:  The patient was admitted and taken to the OR where she underwent an open splenectomy.  She tolerated this procedure well.  Her hgb remained stable throughout her admission.  Postoperatively she did well and her diet was able to be advanced as tolerated.  Her pain was well controlled initially with a PCA, but then with transition to oral meds such as tylenol, toradol, robaxin, and oxycodone; however this was least effective for her.  On POD 2, she was stable for DC home.  She was given the list of vaccinations she is to be given 2 weeks and 10 weeks after splenectomy.  She is going to take this to her PCP so they can vaccinate her.  It was not given while in the hospital and it is recommended they not be given until at least 2 weeks post splenectomy.    Physical Exam: See note from this morning.  Allergies as of 05/16/2017      Reactions   Morphine And Related Itching      Medication List    TAKE these medications   acetaminophen 500 MG tablet Commonly known as:  TYLENOL You can take two 500 mg tablets every 6 hours for pain.  I would do this around the clock until you are no longer in need of pain medications.  If you have 325 mg tablets at  home you can take 3 tablets every 6 hours for pain.  If plain Tylenol is not adequate relieve your pain, you can use the oxycodone.  Both strengths of Tylenol can be bought over-the-counter at any drugstore.  Do not exceed 4000 mg of Tylenol per day, larger doses can harm you   ALPRAZolam 0.25 MG tablet Commonly known as:  XANAX Take 0.25-0.5 mg by mouth 3 (three) times daily as needed for anxiety. For anxiety.   carvedilol 12.5 MG tablet Commonly known as:  COREG Take 12.5 mg by mouth 2 (two) times daily.   ferrous sulfate 325 (65 FE) MG tablet Take 325 mg by mouth daily with breakfast.   hydrochlorothiazide 12.5 MG capsule Commonly known as:  MICROZIDE Take 12.5 mg by mouth  daily.   hydrocortisone-pramoxine 2.5-1 % rectal cream Commonly known as:  ANALPRAM-HC Place rectally 3 (three) times daily.   ibuprofen 200 MG tablet Commonly known as:  ADVIL Take 3-4 tablets (600-800 mg total) by mouth every 8 (eight) hours as needed for headache or moderate pain.   methocarbamol 750 MG tablet Commonly known as:  ROBAXIN Take 1 tablet (750 mg total) by mouth every 6 (six) hours as needed for muscle spasms.   multivitamins with iron Tabs tablet You need a multivitamin with iron to help rebuild your blood.  Be sure your multivitamin has iron.  You can buy this over the counter at any drug store.   oxyCODONE 5 MG immediate release tablet Commonly known as:  Oxy IR/ROXICODONE Take 1 tablet (5 mg total) by mouth every 4 (four) hours as needed for moderate pain or breakthrough pain. What changed:    how much to take  reasons to take this   venlafaxine XR 75 MG 24 hr capsule Commonly known as:  EFFEXOR-XR Take 150 mg by mouth daily.   vitamin C 1000 MG tablet Take 1,000 mg by mouth daily.        Follow-up Information    Abigail MiyamotoBlackman, Douglas, MD. Schedule an appointment as soon as possible for a visit in 3 week(s).   Specialty:  General Surgery Why:  our office will call you with appointment date and time.  if you do not hear from them by late Monday or Tuesday, please call them to schedule a staple removal appointment and follow up with Dr. Magnus IvanBlackman in 2-3 weeks Contact information: [address] [place] [address] [phone]        Barbie BannerWilson, Fred H, MD. Schedule an appointment as soon as possible for a visit in 2 week(s).   Specialty:  Family Medicine Why:  to get vaccines Contact information: [address] [place] [address] [phone]           Signed: Barnetta ChapelKelly Felice Deem, [facility] 05/16/2017, 3:31 PM Pager: [phone]

## 2017-05-16 NOTE — Progress Notes (Signed)
2 Days Post-Op  Subjective: Patient feels better today than yesterday.  Tolerating clears.  Passing a very small amount of flatus.  Voiding on her own.  Mobilizing.  Objective: Vital signs in last 24 hours: Temp:  [98.3 F (36.8 C)-99.1 F (37.3 C)] 98.3 F (36.8 C) (04/05 0516) Pulse Rate:  [79-86] 79 (04/05 0516) Resp:  [11-20] 14 (04/05 0516) BP: (92-110)/(62-66) 103/66 (04/05 0516) SpO2:  [92 %-96 %] 94 % (04/05 0516) Weight:  [166 lb 3.6 oz (75.4 kg)] 166 lb 3.6 oz (75.4 kg) (04/04 1038) Last BM Date: 05/12/17  Intake/Output from previous day: 04/04 0701 - 04/05 0700 In: 2940 [P.O.:1130; I.V.:1810] Out: 750 [Urine:750] Intake/Output this shift: No intake/output data recorded.  PE: Abd: soft, appropriately tender, +BS, incision is c/d/i with staples  Lab Results:  Recent Labs    05/14/17 0435 05/15/17 0459  WBC 10.0 19.6*  HGB 10.6* 9.6*  HCT 32.9* 29.7*  PLT 563* 525*   BMET Recent Labs    05/14/17 0435 05/15/17 0459  NA 137 136  K 3.9 4.2  CL 99* 103  CO2 26 26  GLUCOSE 97 140*  BUN 13 9  CREATININE 0.70 0.59  CALCIUM 9.5 8.9   PT/INR No results for input(s): LABPROT, INR in the last 72 hours. CMP     Component Value Date/Time   NA 136 05/15/2017 0459   K 4.2 05/15/2017 0459   CL 103 05/15/2017 0459   CO2 26 05/15/2017 0459   GLUCOSE 140 (H) 05/15/2017 0459   BUN 9 05/15/2017 0459   CREATININE 0.59 05/15/2017 0459   CALCIUM 8.9 05/15/2017 0459   PROT 6.4 (L) 05/15/2017 0459   ALBUMIN 3.0 (L) 05/15/2017 0459   AST 51 (H) 05/15/2017 0459   ALT 58 (H) 05/15/2017 0459   ALKPHOS 105 05/15/2017 0459   BILITOT 0.3 05/15/2017 0459   GFRNONAA >60 05/15/2017 0459   GFRAA >60 05/15/2017 0459   Lipase     Component Value Date/Time   LIPASE 28 05/14/2017 0435       Studies/Results: Dg Chest Port 1 View  Result Date: 05/14/2017 CLINICAL DATA:  Preoperative evaluation for splenic surgery EXAM: PORTABLE CHEST 1 VIEW COMPARISON:  Portable  exam 0952 hours without priors for comparison FINDINGS: Normal heart size, mediastinal contours, and pulmonary vascularity. Mild LEFT basilar atelectasis. Lungs otherwise clear. No acute infiltrate, pleural effusion or pneumothorax. Bones unremarkable. IMPRESSION: LEFT basilar atelectasis. Electronically Signed   By: Ulyses Southward M.D.   On: 05/14/2017 10:13    Anti-infectives: Anti-infectives (From admission, onward)   Start     Dose/Rate Route Frequency Ordered Stop   05/14/17 1000  ceFAZolin (ANCEF) IVPB 2g/100 mL premix     2 g 200 mL/hr over 30 Minutes Intravenous On call to O.R. 05/14/17 0950 05/14/17 1142       Assessment/Plan Subcapsular splenic hematoma, unknown etiology, POD 2, s/p ex lap with splenectomy -good bowel activity, will advance to regular diet -mobilize TID -add robaxin for muscle spasms  -Dc PCA, start on oral pain meds -Toradol prn   Anxiety - cont home dose of Xanax HTN - resume home medications Anemia - ferrous sulfate at home.  Will see what hgb is today  FEN - regular diet/ SLIV VTE - SCDs, Lovenox ID - ancef preop  Dispo: home maybe later today if she is having good bowel function.  She will need her splenic vaccines as an outpatient in 2 weeks.    LOS: 2 days  Letha CapeKelly E Kenniel Bergsma , [facility] 05/16/2017, 9:51 AM Pager: [phone]

## 2017-05-16 NOTE — Progress Notes (Signed)
Patient complains about her PCA keeps on beeping, reports she could not stand the noise and wants it off. On call provider was notified and provided new orders. We will continue to monitor.

## 2017-05-18 LAB — BPAM RBC
BLOOD PRODUCT EXPIRATION DATE: 201904252359
Blood Product Expiration Date: 201904252359
Unit Type and Rh: 7300
Unit Type and Rh: 7300

## 2017-05-18 LAB — TYPE AND SCREEN
ABO/RH(D): B POS
Antibody Screen: NEGATIVE
UNIT DIVISION: 0
UNIT DIVISION: 0

## 2017-05-29 ENCOUNTER — Emergency Department (HOSPITAL_COMMUNITY)
Admission: EM | Admit: 2017-05-29 | Discharge: 2017-05-29 | Discharge status: Against Medical Advice | Payer: BC Managed Care – PPO

## 2017-09-29 ENCOUNTER — Ambulatory Visit: Payer: BC Managed Care – PPO | Admitting: Women's Health

## 2017-09-29 ENCOUNTER — Encounter: Payer: Self-pay | Admitting: Women's Health

## 2017-09-29 VITALS — BP 158/82 | Ht 66.0 in | Wt 165.0 lb

## 2017-09-29 DIAGNOSIS — Z13820 Encounter for screening for osteoporosis: Secondary | ICD-10-CM | POA: Diagnosis not present

## 2017-09-29 DIAGNOSIS — Z01419 Encounter for gynecological examination (general) (routine) without abnormal findings: Secondary | ICD-10-CM

## 2017-09-29 MED ORDER — ESTRADIOL 10 MCG VA TABS: ORAL_TABLET | VAGINAL | 11 refills | Status: DC

## 2017-09-29 NOTE — Patient Instructions (Signed)
Health Maintenance for Postmenopausal Women Menopause is a normal process in which your reproductive ability comes to an end. This process happens gradually over a span of months to years, usually between the ages of 22 and 9. Menopause is complete when you have missed 12 consecutive menstrual periods. It is important to talk with your health care provider about some of the most common conditions that affect postmenopausal women, such as heart disease, cancer, and bone loss (osteoporosis). Adopting a healthy lifestyle and getting preventive care can help to promote your health and wellness. Those actions can also lower your chances of developing some of these common conditions. What should I know about menopause? During menopause, you may experience a number of symptoms, such as:  Moderate-to-severe hot flashes.  Night sweats.  Decrease in sex drive.  Mood swings.  Headaches.  Tiredness.  Irritability.  Memory problems.  Insomnia.  Choosing to treat or not to treat menopausal changes is an individual decision that you make with your health care provider. What should I know about hormone replacement therapy and supplements? Hormone therapy products are effective for treating symptoms that are associated with menopause, such as hot flashes and night sweats. Hormone replacement carries certain risks, especially as you become older. If you are thinking about using estrogen or estrogen with progestin treatments, discuss the benefits and risks with your health care provider. What should I know about heart disease and stroke? Heart disease, heart attack, and stroke become more likely as you age. This may be due, in part, to the hormonal changes that your body experiences during menopause. These can affect how your body processes dietary fats, triglycerides, and cholesterol. Heart attack and stroke are both medical emergencies. There are many things that you can do to help prevent heart disease  and stroke:  Have your blood pressure checked at least every 1-2 years. High blood pressure causes heart disease and increases the risk of stroke.  If you are 53-22 years old, ask your health care provider if you should take aspirin to prevent a heart attack or a stroke.  Do not use any tobacco products, including cigarettes, chewing tobacco, or electronic cigarettes. If you need help quitting, ask your health care provider.  It is important to eat a healthy diet and maintain a healthy weight. ? Be sure to include plenty of vegetables, fruits, low-fat dairy products, and lean protein. ? Avoid eating foods that are high in solid fats, added sugars, or salt (sodium).  Get regular exercise. This is one of the most important things that you can do for your health. ? Try to exercise for at least 150 minutes each week. The type of exercise that you do should increase your heart rate and make you sweat. This is known as moderate-intensity exercise. ? Try to do strengthening exercises at least twice each week. Do these in addition to the moderate-intensity exercise.  Know your numbers.Ask your health care provider to check your cholesterol and your blood glucose. Continue to have your blood tested as directed by your health care provider.  What should I know about cancer screening? There are several types of cancer. Take the following steps to reduce your risk and to catch any cancer development as early as possible. Breast Cancer  Practice breast self-awareness. ? This means understanding how your breasts normally appear and feel. ? It also means doing regular breast self-exams. Let your health care provider know about any changes, no matter how small.  If you are 40  or older, have a clinician do a breast exam (clinical breast exam or CBE) every year. Depending on your age, family history, and medical history, it may be recommended that you also have a yearly breast X-ray (mammogram).  If you  have a family history of breast cancer, talk with your health care provider about genetic screening.  If you are at high risk for breast cancer, talk with your health care provider about having an MRI and a mammogram every year.  Breast cancer (BRCA) gene test is recommended for women who have family members with BRCA-related cancers. Results of the assessment will determine the need for genetic counseling and BRCA1 and for BRCA2 testing. BRCA-related cancers include these types: ? Breast. This occurs in males or females. ? Ovarian. ? Tubal. This may also be called fallopian tube cancer. ? Cancer of the abdominal or pelvic lining (peritoneal cancer). ? Prostate. ? Pancreatic.  Cervical, Uterine, and Ovarian Cancer Your health care provider may recommend that you be screened regularly for cancer of the pelvic organs. These include your ovaries, uterus, and vagina. This screening involves a pelvic exam, which includes checking for microscopic changes to the surface of your cervix (Pap test).  For women ages 21-65, health care providers may recommend a pelvic exam and a Pap test every three years. For women ages 79-65, they may recommend the Pap test and pelvic exam, combined with testing for human papilloma virus (HPV), every five years. Some types of HPV increase your risk of cervical cancer. Testing for HPV may also be done on women of any age who have unclear Pap test results.  Other health care providers may not recommend any screening for nonpregnant women who are considered low risk for pelvic cancer and have no symptoms. Ask your health care provider if a screening pelvic exam is right for you.  If you have had past treatment for cervical cancer or a condition that could lead to cancer, you need Pap tests and screening for cancer for at least 20 years after your treatment. If Pap tests have been discontinued for you, your risk factors (such as having a new sexual partner) need to be  reassessed to determine if you should start having screenings again. Some women have medical problems that increase the chance of getting cervical cancer. In these cases, your health care provider may recommend that you have screening and Pap tests more often.  If you have a family history of uterine cancer or ovarian cancer, talk with your health care provider about genetic screening.  If you have vaginal bleeding after reaching menopause, tell your health care provider.  There are currently no reliable tests available to screen for ovarian cancer.  Lung Cancer Lung cancer screening is recommended for adults 69-62 years old who are at high risk for lung cancer because of a history of smoking. A yearly low-dose CT scan of the lungs is recommended if you:  Currently smoke.  Have a history of at least 30 pack-years of smoking and you currently smoke or have quit within the past 15 years. A pack-year is smoking an average of one pack of cigarettes per day for one year.  Yearly screening should:  Continue until it has been 15 years since you quit.  Stop if you develop a health problem that would prevent you from having lung cancer treatment.  Colorectal Cancer  This type of cancer can be detected and can often be prevented.  Routine colorectal cancer screening usually begins at  age 42 and continues through age 45.  If you have risk factors for colon cancer, your health care provider may recommend that you be screened at an earlier age.  If you have a family history of colorectal cancer, talk with your health care provider about genetic screening.  Your health care provider may also recommend using home test kits to check for hidden blood in your stool.  A small camera at the end of a tube can be used to examine your colon directly (sigmoidoscopy or colonoscopy). This is done to check for the earliest forms of colorectal cancer.  Direct examination of the colon should be repeated every  5-10 years until age 71. However, if early forms of precancerous polyps or small growths are found or if you have a family history or genetic risk for colorectal cancer, you may need to be screened more often.  Skin Cancer  Check your skin from head to toe regularly.  Monitor any moles. Be sure to tell your health care provider: ? About any new moles or changes in moles, especially if there is a change in a mole's shape or color. ? If you have a mole that is larger than the size of a pencil eraser.  If any of your family members has a history of skin cancer, especially at a Arpi Diebold age, talk with your health care provider about genetic screening.  Always use sunscreen. Apply sunscreen liberally and repeatedly throughout the day.  Whenever you are outside, protect yourself by wearing long sleeves, pants, a wide-brimmed hat, and sunglasses.  What should I know about osteoporosis? Osteoporosis is a condition in which bone destruction happens more quickly than new bone creation. After menopause, you may be at an increased risk for osteoporosis. To help prevent osteoporosis or the bone fractures that can happen because of osteoporosis, the following is recommended:  If you are 46-71 years old, get at least 1,000 mg of calcium and at least 600 mg of vitamin D per day.  If you are older than age 55 but younger than age 65, get at least 1,200 mg of calcium and at least 600 mg of vitamin D per day.  If you are older than age 54, get at least 1,200 mg of calcium and at least 800 mg of vitamin D per day.  Smoking and excessive alcohol intake increase the risk of osteoporosis. Eat foods that are rich in calcium and vitamin D, and do weight-bearing exercises several times each week as directed by your health care provider. What should I know about how menopause affects my mental health? Depression may occur at any age, but it is more common as you become older. Common symptoms of depression  include:  Low or sad mood.  Changes in sleep patterns.  Changes in appetite or eating patterns.  Feeling an overall lack of motivation or enjoyment of activities that you previously enjoyed.  Frequent crying spells.  Talk with your health care provider if you think that you are experiencing depression. What should I know about immunizations? It is important that you get and maintain your immunizations. These include:  Tetanus, diphtheria, and pertussis (Tdap) booster vaccine.  Influenza every year before the flu season begins.  Pneumonia vaccine.  Shingles vaccine.  Your health care provider may also recommend other immunizations. This information is not intended to replace advice given to you by your health care provider. Make sure you discuss any questions you have with your health care provider. Document Released: 03/22/2005  Document Revised: 08/18/2015 Document Reviewed: 11/01/2014 Elsevier Interactive Patient Education  2018 Elsevier Inc.  

## 2017-09-29 NOTE — Progress Notes (Signed)
Adrienne [name] June 10, 1960 [id number]    History:    Presents for annual exam.  Postmenopausal with no bleeding on no HRT.  Abnormal Pap in her 5720s with cryo normal Paps since.   Normal  mammogram history.  2002 LSO for benign cyst.  2015 benign colon polyp 5-year follow-up.  2015 normal DEXA.  This past year she has had appendectomy, splenectomy and a left fractured ankle.  Primary care manages labs and meds hypertension, anxiety/depression.  Smoker trying to quit.  History of endometriosis.  Past medical history, past surgical history, family history and social history were all reviewed and documented in the EPIC chart.  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  ROS:  A ROS was performed and pertinent positives and negatives are included.  Exam:  Vitals:   09/29/17 1558  BP: (!) 158/82  Weight: 165 lb (74.8 kg)  Height: 5\' 6"  (1.676 m)   Body mass index is 26.63 kg/m.   General appearance:  Normal Thyroid:  Symmetrical, normal in size, without palpable masses or nodularity. Respiratory  Auscultation:  Clear without wheezing or rhonchi Cardiovascular  Auscultation:  Regular rate, without rubs, murmurs or gallops  Edema/varicosities:  Not grossly evident Abdominal  Soft,nontender, without masses, guarding or rebound.  Liver/spleen:  No organomegaly noted  Hernia:  None appreciated  Skin  Inspection:  Grossly normal   Breasts: Examined lying and sitting.     Right: Without masses, retractions, discharge or axillary adenopathy.     Left: Without masses, retractions, discharge or axillary adenopathy. Gentitourinary   Inguinal/mons:  Normal without inguinal adenopathy  External genitalia:  Normal  BUS/Urethra/Skene's glands:  Normal  Vagina:  Normal  Cervix:  Normal  Uterus:   normal in size, shape and contour.  Midline and mobile  Adnexa/parametria:     Rt: Without masses or tenderness.   Lt: Without masses or tenderness.  Anus and perineum: Normal  Digital rectal exam: Normal sphincter tone  without palpated masses or tenderness  Assessment/Plan:  57 y.o. MWF G1P0 for annual exam with complaint of vaginal dryness.  Postmenopausal/no HRT/no bleeding with dyspareunia Hypertension, anxiety/depression-primary care manages labs and meds Smoker 2019 appendectomy, splenectomy and left fractured ankle  Plan: Options for dyspareunia reviewed, will try Vagifem 1 applicator at bedtime for 2 weeks and then twice weekly thereafter and continue over-the-counter vaginal lubricants.  Instructed to call if no relief.  Aware blood pressure elevated today, states just returned from a vacation where she missed several doses of antihypertensives.  Aware of importance of taking daily, hazards of smoking, is in a smoking cessation program currently.  SBE's, continue annual screening mammogram overdue instructed to schedule.  Exercise as able, calcium rich foods, vitamin D 2000 daily encouraged.  DEXA.  Pap with negative HR HPV 2018, new screening guidelines reviewed.  Harrington Challengerancy J Akanksha Bellmore [facility] HospitalWHNP, 4:37 PM 09/29/2017

## 2018-01-13 ENCOUNTER — Other Ambulatory Visit: Payer: Self-pay | Admitting: Surgery

## 2018-01-16 ENCOUNTER — Other Ambulatory Visit: Payer: Self-pay

## 2018-01-16 ENCOUNTER — Encounter (HOSPITAL_COMMUNITY): Payer: Self-pay | Admitting: *Deleted

## 2018-01-16 NOTE — Progress Notes (Signed)
Spoke with pt for pre-op call. Pt states she was told as a young woman that she had Mitral Valve Prolapse, she states about 15 years ago she was told she did not have it. Pt states she is not diabetic.

## 2018-01-18 NOTE — H&P (Signed)
  Adrienne [name] Documented: 01/13/2018 2:33 PM Location: [facility] Patient #: [id number] DOB: [birth year] Married / Language: [place] / Race: White Female   History of Present Illness (Adrienne [name] A. Magnus IvanBlackman MD; 01/13/2018 2:45 PM) The patient is a 57 year old female who presents for an evaluation of a hernia. 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 She has now noticed a hernia at her incision just above the umbilicus. She noticed a bulge recently. She has had no obstructive symptoms. She is otherwise without complaints.  Past Medical History:  Diagnosis Date  . Anxiety    EFFEXOR-DR. FRED [name]  . Cervical dysplasia   . Elevated cholesterol   . Endometriosis   . Migraine with aura 12/2006   COULD NOT SPEAK, pt has aura without migraines  . Ovarian cyst    Past Surgical History:  Procedure Laterality Date  . ABDOMINAL SURGERY  2002   EXPLORATORY LAPAROTOMY, LSO , RIGHT OV CYSTECTOMY  . BRONCHIAL CLEFT CYST    . COLPOSCOPY    . GYNECOLOGIC CRYOSURGERY    . LAPAROSCOPIC APPENDECTOMY N/A 04/12/2017   Procedure: APPENDECTOMY LAPAROSCOPIC;  Surgeon: Karie SodaGross, Steven, MD;  Location: [facility];  Service: General;  Laterality: N/A;  . OOPHORECTOMY  2002   LSO, R OVARIAN CYSTECTOMY, EXPLOR LAPAROT  . PELVIC LAPAROSCOPY    . TYMPANOSTOMY TUBE PLACEMENT       .  Splenectomy  05/14/2017  Allergies (Adrienne [name], CMA; 01/13/2018 2:34 PM) Morphine Sulfate (Concentrate) *ANALGESICS - OPIOID*  Rash.  Medication History Doristine Devoid(Adrienne [name], CMA; 01/13/2018 2:34 PM) OxyCODONE HCl (5MG  Tablet, Oral) Active. ALPRAZolam (0.5MG  Tablet, Oral) Active. Amoxicillin-Pot Clavulanate (875-125MG  Tablet, Oral) Active. Carvedilol (12.5MG  Tablet, Oral) Active. HydroCHLOROthiazide (12.5MG  Capsule, Oral) Active. Methocarbamol (750MG  Tablet, Oral) Active. Nicotrol (10MG  Inhaler, Inhalation)  Active. Venlafaxine HCl ER (150MG  Capsule ER 24HR, Oral) Active. Medications Reconciled  Vitals (Adrienne [name] CMA; 01/13/2018 2:34 PM) 01/13/2018 2:34 PM Weight: 168.2 lb Height: 66in Body Surface Area: 1.86 m Body Mass Index: 27.15 kg/m  Temp.: 97.59F(Oral)  Pulse: 100 (Regular)  BP: 128/80 (Sitting, Left Arm, Standard)       Physical Exam (Adrienne [name] A. Magnus IvanBlackman MD; 01/13/2018 2:45 PM) The physical exam findings are as follows: Note:On exam, she does have an easily reducible small incisional hernia just above the umbilicus. The rest of her incision is intact. The actual fascial defect is about 2-3 cm in size. Generally she is well in appearance Lungs clear CV RRR Skin without rashes or jaundice Ext without edema    Assessment & Plan (Adrienne [name] A. Magnus IvanBlackman MD; 01/13/2018 2:46 PM) Adrienne HessINCISIONAL HERNIA (K43.2) Impression: I discussed the diagnosis with the patient and her husband. She would like to proceed with a small open hernia repair with mesh as an outpatient which I believe is reasonable. I believe the fascial defect is small and we can do this open. I discussed the risks of surgery with her and her husband. These risks include but are not limited to bleeding, infection, injury to surrounding structures, the need for further procedures

## 2018-01-19 ENCOUNTER — Ambulatory Visit (HOSPITAL_COMMUNITY)
Admission: RE | Admit: 2018-01-19 | Discharge: 2018-01-19 | Disposition: A | Discharge status: Home / Self Care | Payer: BC Managed Care – PPO | Attending: Surgery | Admitting: Surgery

## 2018-01-19 ENCOUNTER — Ambulatory Visit (HOSPITAL_COMMUNITY): Payer: BC Managed Care – PPO | Admitting: Critical Care Medicine

## 2018-01-19 ENCOUNTER — Encounter (HOSPITAL_COMMUNITY): Payer: Self-pay | Admitting: Critical Care Medicine

## 2018-01-19 ENCOUNTER — Encounter (HOSPITAL_COMMUNITY)
Admission: RE | Disposition: A | Discharge status: Home / Self Care | Payer: Self-pay | Source: Home / Self Care | Attending: Surgery

## 2018-01-19 ENCOUNTER — Other Ambulatory Visit: Payer: Self-pay

## 2018-01-19 DIAGNOSIS — K432 Incisional hernia without obstruction or gangrene: Secondary | ICD-10-CM | POA: Insufficient documentation

## 2018-01-19 DIAGNOSIS — F1190 Opioid use, unspecified, uncomplicated: Secondary | ICD-10-CM | POA: Diagnosis not present

## 2018-01-19 DIAGNOSIS — Z79899 Other long term (current) drug therapy: Secondary | ICD-10-CM | POA: Insufficient documentation

## 2018-01-19 DIAGNOSIS — F17200 Nicotine dependence, unspecified, uncomplicated: Secondary | ICD-10-CM | POA: Diagnosis not present

## 2018-01-19 DIAGNOSIS — I10 Essential (primary) hypertension: Secondary | ICD-10-CM | POA: Diagnosis not present

## 2018-01-19 DIAGNOSIS — F419 Anxiety disorder, unspecified: Secondary | ICD-10-CM | POA: Diagnosis not present

## 2018-01-19 HISTORY — DX: Essential (primary) hypertension: I10

## 2018-01-19 HISTORY — PX: INSERTION OF MESH: SHX5868

## 2018-01-19 HISTORY — PX: INCISIONAL HERNIA REPAIR: SHX193

## 2018-01-19 LAB — BASIC METABOLIC PANEL
Anion gap: 11 (ref 5–15)
BUN: 11 mg/dL (ref 6–20)
CO2: 28 mmol/L (ref 22–32)
CREATININE: 1 mg/dL (ref 0.44–1.00)
Calcium: 9.5 mg/dL (ref 8.9–10.3)
Chloride: 99 mmol/L (ref 98–111)
GFR calc Af Amer: >60 mL/min (ref >60)
GFR calc non Af Amer: >60 mL/min (ref >60)
Glucose, Bld: 102 mg/dL — ABNORMAL HIGH (ref 70–99)
Potassium: 3.8 mmol/L (ref 3.5–5.1)
SODIUM: 138 mmol/L (ref 135–145)

## 2018-01-19 LAB — CBC
HCT: 41.3 % (ref 36.0–46.0)
Hemoglobin: 14 g/dL (ref 12.0–15.0)
MCH: 34.5 pg — ABNORMAL HIGH (ref 26.0–34.0)
MCHC: 33.9 g/dL (ref 30.0–36.0)
MCV: 101.7 fL — ABNORMAL HIGH (ref 80.0–100.0)
Platelets: 409 10*3/uL — ABNORMAL HIGH (ref 150–400)
RBC: 4.06 MIL/uL (ref 3.87–5.11)
RDW: 12 % (ref 11.5–15.5)
WBC: 9.5 10*3/uL (ref 4.0–10.5)
nRBC: 0 % (ref 0.0–0.2)

## 2018-01-19 SURGERY — REPAIR, HERNIA, INCISIONAL
Anesthesia: General | Site: Abdomen

## 2018-01-19 MED ORDER — ROCURONIUM BROMIDE 50 MG/5ML IV SOSY
PREFILLED_SYRINGE | INTRAVENOUS | Status: AC
  Filled 2018-01-19: qty 15

## 2018-01-19 MED ORDER — ACETAMINOPHEN 500 MG PO TABS
1000.0000 mg | ORAL_TABLET | ORAL | Status: AC
  Administered 2018-01-19: 1000 mg via ORAL
  Filled 2018-01-19: qty 2

## 2018-01-19 MED ORDER — CEFAZOLIN SODIUM-DEXTROSE 2-4 GM/100ML-% IV SOLN
2.0000 g | INTRAVENOUS | Status: AC
  Administered 2018-01-19: 2 g via INTRAVENOUS
  Filled 2018-01-19: qty 100

## 2018-01-19 MED ORDER — DEXAMETHASONE SODIUM PHOSPHATE 10 MG/ML IJ SOLN
INTRAMUSCULAR | Status: DC | PRN
  Administered 2018-01-19: 4 mg via INTRAVENOUS

## 2018-01-19 MED ORDER — GLYCOPYRROLATE PF 0.2 MG/ML IJ SOSY
PREFILLED_SYRINGE | INTRAMUSCULAR | Status: DC | PRN
  Administered 2018-01-19 (×2): .1 mg via INTRAVENOUS

## 2018-01-19 MED ORDER — ONDANSETRON HCL 4 MG/2ML IJ SOLN
INTRAMUSCULAR | Status: AC
  Filled 2018-01-19: qty 4

## 2018-01-19 MED ORDER — BUPIVACAINE-EPINEPHRINE 0.25% -1:200000 IJ SOLN
INTRAMUSCULAR | Status: DC | PRN
  Administered 2018-01-19: 30 mL

## 2018-01-19 MED ORDER — CHLORHEXIDINE GLUCONATE CLOTH 2 % EX PADS: 6.0000 | MEDICATED_PAD | Freq: Once | CUTANEOUS | Status: DC

## 2018-01-19 MED ORDER — FENTANYL CITRATE (PF) 250 MCG/5ML IJ SOLN
INTRAMUSCULAR | Status: AC
  Filled 2018-01-19: qty 5

## 2018-01-19 MED ORDER — ONDANSETRON HCL 4 MG/2ML IJ SOLN
INTRAMUSCULAR | Status: DC | PRN
  Administered 2018-01-19: 4 mg via INTRAVENOUS

## 2018-01-19 MED ORDER — EPHEDRINE 5 MG/ML INJ
INTRAVENOUS | Status: AC
  Filled 2018-01-19: qty 10

## 2018-01-19 MED ORDER — EPHEDRINE SULFATE-NACL 50-0.9 MG/10ML-% IV SOSY
PREFILLED_SYRINGE | INTRAVENOUS | Status: DC | PRN
  Administered 2018-01-19 (×3): 5 mg via INTRAVENOUS
  Administered 2018-01-19: 10 mg via INTRAVENOUS
  Administered 2018-01-19 (×3): 5 mg via INTRAVENOUS

## 2018-01-19 MED ORDER — MIDAZOLAM HCL 5 MG/5ML IJ SOLN
INTRAMUSCULAR | Status: DC | PRN
  Administered 2018-01-19: 2 mg via INTRAVENOUS

## 2018-01-19 MED ORDER — LIDOCAINE 2% (20 MG/ML) 5 ML SYRINGE
INTRAMUSCULAR | Status: DC | PRN
  Administered 2018-01-19: 100 mg via INTRAVENOUS

## 2018-01-19 MED ORDER — GLYCOPYRROLATE PF 0.2 MG/ML IJ SOSY
PREFILLED_SYRINGE | INTRAMUSCULAR | Status: AC
  Filled 2018-01-19: qty 1

## 2018-01-19 MED ORDER — DOXYCYCLINE HYCLATE 100 MG PO TABS: 100.0000 mg | ORAL_TABLET | Freq: Two times a day (BID) | ORAL | 0 refills | Status: DC

## 2018-01-19 MED ORDER — PROPOFOL 10 MG/ML IV BOLUS
INTRAVENOUS | Status: DC | PRN
  Administered 2018-01-19: 20 mg via INTRAVENOUS
  Administered 2018-01-19: 170 mg via INTRAVENOUS

## 2018-01-19 MED ORDER — OXYCODONE HCL 5 MG PO TABS: 5.0000 mg | ORAL_TABLET | Freq: Four times a day (QID) | ORAL | 0 refills | Status: DC | PRN

## 2018-01-19 MED ORDER — DEXAMETHASONE SODIUM PHOSPHATE 10 MG/ML IJ SOLN
INTRAMUSCULAR | Status: AC
  Filled 2018-01-19: qty 2

## 2018-01-19 MED ORDER — PROPOFOL 10 MG/ML IV BOLUS
INTRAVENOUS | Status: AC
  Filled 2018-01-19: qty 20

## 2018-01-19 MED ORDER — FENTANYL CITRATE (PF) 250 MCG/5ML IJ SOLN
INTRAMUSCULAR | Status: DC | PRN
  Administered 2018-01-19: 50 ug via INTRAVENOUS
  Administered 2018-01-19: 25 ug via INTRAVENOUS

## 2018-01-19 MED ORDER — LACTATED RINGERS IV SOLN
INTRAVENOUS | Status: DC
  Administered 2018-01-19 (×2): via INTRAVENOUS

## 2018-01-19 MED ORDER — 0.9 % SODIUM CHLORIDE (POUR BTL) OPTIME
TOPICAL | Status: DC | PRN
  Administered 2018-01-19: 1000 mL

## 2018-01-19 MED ORDER — PHENYLEPHRINE 40 MCG/ML (10ML) SYRINGE FOR IV PUSH (FOR BLOOD PRESSURE SUPPORT)
PREFILLED_SYRINGE | INTRAVENOUS | Status: DC | PRN
  Administered 2018-01-19: 40 ug via INTRAVENOUS

## 2018-01-19 MED ORDER — BUPIVACAINE-EPINEPHRINE (PF) 0.25% -1:200000 IJ SOLN
INTRAMUSCULAR | Status: AC
  Filled 2018-01-19: qty 30

## 2018-01-19 MED ORDER — LIDOCAINE 2% (20 MG/ML) 5 ML SYRINGE
INTRAMUSCULAR | Status: AC
  Filled 2018-01-19: qty 15

## 2018-01-19 MED ORDER — GABAPENTIN 300 MG PO CAPS
300.0000 mg | ORAL_CAPSULE | ORAL | Status: AC
  Administered 2018-01-19: 300 mg via ORAL
  Filled 2018-01-19: qty 1

## 2018-01-19 MED ORDER — CELECOXIB 200 MG PO CAPS
200.0000 mg | ORAL_CAPSULE | ORAL | Status: AC
  Administered 2018-01-19: 200 mg via ORAL
  Filled 2018-01-19: qty 1

## 2018-01-19 MED ORDER — MIDAZOLAM HCL 2 MG/2ML IJ SOLN
INTRAMUSCULAR | Status: AC
  Filled 2018-01-19: qty 2

## 2018-01-19 MED ORDER — PHENYLEPHRINE 40 MCG/ML (10ML) SYRINGE FOR IV PUSH (FOR BLOOD PRESSURE SUPPORT)
PREFILLED_SYRINGE | INTRAVENOUS | Status: AC
  Filled 2018-01-19: qty 20

## 2018-01-19 MED ORDER — SCOPOLAMINE 1 MG/3DAYS TD PT72
MEDICATED_PATCH | TRANSDERMAL | Status: AC
  Filled 2018-01-19: qty 1

## 2018-01-19 SURGICAL SUPPLY — 42 items
BLADE CLIPPER SURG (BLADE) IMPLANT
CANISTER SUCT 3000ML PPV (MISCELLANEOUS) ×3 IMPLANT
CHLORAPREP W/TINT 26ML (MISCELLANEOUS) ×3 IMPLANT
COVER SURGICAL LIGHT HANDLE (MISCELLANEOUS) ×3 IMPLANT
COVER WAND RF STERILE (DRAPES) IMPLANT
DERMABOND ADVANCED (GAUZE/BANDAGES/DRESSINGS) ×2
DERMABOND ADVANCED .7 DNX12 (GAUZE/BANDAGES/DRESSINGS) ×1 IMPLANT
DRAPE LAPAROSCOPIC ABDOMINAL (DRAPES) ×3 IMPLANT
DRSG TEGADERM 4X4.75 (GAUZE/BANDAGES/DRESSINGS) IMPLANT
ELECT CAUTERY BLADE 6.4 (BLADE) ×3 IMPLANT
ELECT REM PT RETURN 9FT ADLT (ELECTROSURGICAL) ×3
ELECTRODE REM PT RTRN 9FT ADLT (ELECTROSURGICAL) ×1 IMPLANT
GAUZE SPONGE 4X4 12PLY STRL (GAUZE/BANDAGES/DRESSINGS) IMPLANT
GLOVE BIOGEL PI IND STRL 6.5 (GLOVE) ×1 IMPLANT
GLOVE BIOGEL PI IND STRL 8 (GLOVE) ×1 IMPLANT
GLOVE BIOGEL PI INDICATOR 6.5 (GLOVE) ×2
GLOVE BIOGEL PI INDICATOR 8 (GLOVE) ×2
GLOVE SURG SIGNA 7.5 PF LTX (GLOVE) ×3 IMPLANT
GLOVE SURG SS PI 6.0 STRL IVOR (GLOVE) ×3 IMPLANT
GOWN STRL REUS W/ TWL LRG LVL3 (GOWN DISPOSABLE) ×1 IMPLANT
GOWN STRL REUS W/ TWL XL LVL3 (GOWN DISPOSABLE) ×1 IMPLANT
GOWN STRL REUS W/TWL LRG LVL3 (GOWN DISPOSABLE) ×2
GOWN STRL REUS W/TWL XL LVL3 (GOWN DISPOSABLE) ×2
KIT BASIN OR (CUSTOM PROCEDURE TRAY) ×3 IMPLANT
KIT TURNOVER KIT B (KITS) ×3 IMPLANT
MANIFOLD NEPTUNE WASTE (CANNULA) ×3 IMPLANT
MESH VENTRALEX ST 2.5 CRC MED (Mesh General) ×3 IMPLANT
NEEDLE HYPO 25GX1X1/2 BEV (NEEDLE) ×3 IMPLANT
NS IRRIG 1000ML POUR BTL (IV SOLUTION) ×3 IMPLANT
PACK GENERAL/GYN (CUSTOM PROCEDURE TRAY) ×3 IMPLANT
PAD ARMBOARD 7.5X6 YLW CONV (MISCELLANEOUS) ×3 IMPLANT
PENCIL SMOKE EVACUATOR COATED (MISCELLANEOUS) ×3 IMPLANT
STAPLER VISISTAT 35W (STAPLE) IMPLANT
SUT MNCRL AB 4-0 PS2 18 (SUTURE) IMPLANT
SUT NOVA NAB DX-16 0-1 5-0 T12 (SUTURE) IMPLANT
SUT PROLENE 1 CT (SUTURE) ×6 IMPLANT
SUT VIC AB 3-0 SH 27 (SUTURE)
SUT VIC AB 3-0 SH 27XBRD (SUTURE) IMPLANT
SYR CONTROL 10ML LL (SYRINGE) ×3 IMPLANT
TOWEL OR 17X24 6PK STRL BLUE (TOWEL DISPOSABLE) ×3 IMPLANT
TOWEL OR 17X26 10 PK STRL BLUE (TOWEL DISPOSABLE) IMPLANT
TRAY FOLEY MTR SLVR 14FR STAT (SET/KITS/TRAYS/PACK) IMPLANT

## 2018-01-19 NOTE — Anesthesia Preprocedure Evaluation (Signed)
Anesthesia Evaluation  Patient identified by MRN, date of birth, ID band Patient awake    Reviewed: Allergy & Precautions, NPO status , Patient's Chart, lab work & pertinent test results  Airway Mallampati: II  TM Distance: >3 FB Neck ROM: Full    Dental no notable dental hx. (+) Dental Advisory Given   Pulmonary Current Smoker,    Pulmonary exam normal breath sounds clear to auscultation       Cardiovascular hypertension, Pt. on medications and Pt. on home beta blockers Normal cardiovascular exam Rhythm:Regular Rate:Normal     Neuro/Psych  Headaches, PSYCHIATRIC DISORDERS Anxiety Depression    GI/Hepatic negative GI ROS, Neg liver ROS,   Endo/Other  negative endocrine ROS  Renal/GU negative Renal ROS     Musculoskeletal   Abdominal   Peds  Hematology HLD   Anesthesia Other Findings Appendicitis  Reproductive/Obstetrics                             Anesthesia Physical  Anesthesia Plan  ASA: III  Anesthesia Plan: General   Post-op Pain Management:    Induction: Intravenous  PONV Risk Score and Plan: 2 and Ondansetron, Dexamethasone, Midazolam and Treatment may vary due to age or medical condition  Airway Management Planned: Oral ETT  Additional Equipment:   Intra-op Plan:   Post-operative Plan: Extubation in OR  Informed Consent: I have reviewed the patients History and Physical, chart, labs and discussed the procedure including the risks, benefits and alternatives for the proposed anesthesia with the patient or authorized representative who has indicated his/her understanding and acceptance.   Dental advisory given  Plan Discussed with: CRNA, Anesthesiologist and Surgeon  Anesthesia Plan Comments:         Anesthesia Quick Evaluation

## 2018-01-19 NOTE — Interval H&P Note (Signed)
History and Physical Interval Note:no change in H and P  01/19/2018 8:32 AM  Adrienne [name]  has presented today for surgery, with the diagnosis of Incisional hernia  The various methods of treatment have been discussed with the patient and family. After consideration of risks, benefits and other options for treatment, the patient has consented to  Procedure(s): INCISIONAL HERNIA REPAIR WITH MESH ERAS PATHWAY (N/A) INSERTION OF MESH (N/A) as a surgical intervention .  The patient's history has been reviewed, patient examined, no change in status, stable for surgery.  I have reviewed the patient's chart and labs.  Questions were answered to the patient's satisfaction.     [name]

## 2018-01-19 NOTE — Transfer of Care (Signed)
Immediate Anesthesia Transfer of Care Note  Patient: Adrienne [name]  Procedure(s) Performed: INCISIONAL HERNIA REPAIR WITH MESH ERAS PATHWAY (N/A Abdomen) INSERTION OF MESH (N/A Abdomen)  Patient Location: PACU  Anesthesia Type:General  Level of Consciousness: awake, alert  and oriented  Airway & Oxygen Therapy: Patient Spontanous Breathing and Patient connected to nasal cannula oxygen  Post-op Assessment: Report given to RN, Post -op Vital signs reviewed and stable and Patient moving all extremities X 4  Post vital signs: Reviewed and stable  Last Vitals:  Vitals Value Taken Time  BP 126/80 01/19/2018  9:47 AM  Temp    Pulse 75 01/19/2018  9:48 AM  Resp 14 01/19/2018  9:48 AM  SpO2 96 % 01/19/2018  9:48 AM  Vitals shown include unvalidated device data.  Last Pain:  Vitals:   01/19/18 0730  TempSrc:   PainSc: 0-No pain         Complications: No apparent anesthesia complications

## 2018-01-19 NOTE — Anesthesia Postprocedure Evaluation (Signed)
Anesthesia Post Note  Patient: Adrienne [name]  Procedure(s) Performed: INCISIONAL HERNIA REPAIR WITH MESH ERAS PATHWAY (N/A Abdomen) INSERTION OF MESH (N/A Abdomen)     Patient location during evaluation: PACU Anesthesia Type: General Level of consciousness: sedated Pain management: pain level controlled Vital Signs Assessment: post-procedure vital signs reviewed and stable Respiratory status: spontaneous breathing and respiratory function stable Cardiovascular status: stable Postop Assessment: no apparent nausea or vomiting Anesthetic complications: no    Last Vitals:  Vitals:   01/19/18 1017 01/19/18 1031  BP: 123/80 131/85  Pulse: 65   Resp: 15 16  Temp: 36.5 C   SpO2: 94% 95%    Last Pain:  Vitals:   01/19/18 1031  TempSrc:   PainSc: 0-No pain                 Aryelle Figg DANIEL

## 2018-01-19 NOTE — Op Note (Signed)
INCISIONAL HERNIA REPAIR WITH MESH ERAS PATHWAY, INSERTION OF MESH  Procedure Note  Brock BadSusan H Ozer 01/19/2018   Pre-op Diagnosis: Incisional hernia     Post-op Diagnosis: same  Procedure(s): INCISIONAL HERNIA REPAIR WITH MESH ERAS PATHWAY INSERTION OF MESH (6.4 cm round ventralight ST)  Surgeon(s): Abigail MiyamotoBlackman, Orie Baxendale, MD  Anesthesia: General  Staff:  Circulator: Jolinda CroakBigelow, Crystal B, RN Scrub Person: Coralee NorthLondon, Lashonda T  Estimated Blood Loss: Minimal               Procedure: The patient was brought to the operating room and identified as the correct patient.  She is placed upon the operating table and general anesthesia was induced.  Her abdomen was then prepped and draped in the usual sterile fashion.  The palpable fascial defect was right at the umbilicus.  I anesthetized the skin at the midline through the previous scar with Marcaine.  I then made an incision with a scalpel.  I dissected down to the hernia sac which was easily identified.  This was actually from the laparoscopic appendectomy and not from the midline incision for the splenectomy.  I excised the hernia sac and reduce the contents.  I then brought a 6.4 cm round ventral patch onto the field.  This was a Prolene ventral light ST patch from [employer].  I placed it through the fascia opening and pulled it up against the peritoneum with the ties.  I then sutured the mesh in place circumferentially with #1 Novafil sutures.  I then cut the ties and closed the fascia over the top of the mesh with several figure-of-eight and interrupted #1 Novafil sutures.  Wide coverage of the fascial defect appeared to be achieved.  I anesthetized the fascia further with Marcaine.  I then closed the subcutaneous tissue with interrupted 3-0 Vicryl sutures and closed the skin with a running 4-0 Monocryl.  Dermabond was then applied.  The patient tolerated the procedure well.  All the counts were correct at the end of the procedure.  The patient was then  extubated in the operating room and taken in a stable condition to the recovery room.          Kalana Yust A   Date: 01/19/2018  Time: 9:37 AM

## 2018-01-19 NOTE — Discharge Instructions (Signed)
[facility] _______[facility]  UMBILICAL OR INGUINAL HERNIA REPAIR: POST OP INSTRUCTIONS  Always review your discharge instruction sheet given to you by the facility where your surgery was performed. IF YOU HAVE DISABILITY OR FAMILY LEAVE FORMS, YOU MUST BRING THEM TO THE OFFICE FOR PROCESSING.   DO NOT GIVE THEM TO YOUR DOCTOR.  1. A  prescription for pain medication may be given to you upon discharge.  Take your pain medication as prescribed, if needed.  If narcotic pain medicine is not needed, then you may take acetaminophen (Tylenol) or ibuprofen (Advil) as needed. 2. Take your usually prescribed medications unless otherwise directed. If you need a refill on your pain medication, please contact your pharmacy.  They will contact our office to request authorization. Prescriptions will not be filled after 5 pm or on week-ends. 3. You should follow a light diet the first 24 hours after arrival home, such as soup and crackers, etc.  Be sure to include lots of fluids daily.  Resume your normal diet the day after surgery. 4.Most patients will experience some swelling and bruising around the umbilicus or in the groin and scrotum.  Ice packs and reclining will help.  Swelling and bruising can take several days to resolve.  6. It is common to experience some constipation if taking pain medication after surgery.  Increasing fluid intake and taking a stool softener (such as Colace) will usually help or prevent this problem from occurring.  A mild laxative (Milk of Magnesia or Miralax) should be taken according to package directions if there are no bowel movements after 48 hours. 7. Unless discharge instructions indicate otherwise, you may remove your bandages 24-48 hours after surgery, and you may shower at that time.  You may have steri-strips (small skin tapes) in place directly over the incision.  These strips should be left on the skin for 7-10 days.  If your surgeon used skin glue on the  incision, you may shower in 24 hours.  The glue will flake off over the next 2-3 weeks.  Any sutures or staples will be removed at the office during your follow-up visit. 8. ACTIVITIES:  You may resume regular (light) daily activities beginning the next day--such as daily self-care, walking, climbing stairs--gradually increasing activities as tolerated.  You may have sexual intercourse when it is comfortable.  Refrain from any heavy lifting or straining until approved by your doctor.  a.You may drive when you are no longer taking prescription pain medication, you can comfortably wear a seatbelt, and you can safely maneuver your car and apply brakes. b.RETURN TO WORK:   _____________________________________________  9.You should see your doctor in the office for a follow-up appointment approximately 2-3 weeks after your surgery.  Make sure that you call for this appointment within a day or two after you arrive home to insure a convenient appointment time. 10.OTHER INSTRUCTIONS: _NO LIFTING MORE THAN 15 POUNDS FOR 4 WEEKS OK TO SHOWER STARTING TOMORROW ICE PACK, TYLENOL, AND IBUPROFEN ALSO FOR PAIN________________________    _____________________________________  WHEN TO CALL YOUR DOCTOR: 1. Fever over 101.0 2. Inability to urinate 3. Nausea and/or vomiting 4. Extreme swelling or bruising 5. Continued bleeding from incision. 6. Increased pain, redness, or drainage from the incision  The clinic staff is available to answer your questions during regular business hours.  Please dont hesitate to call and ask to speak to one of the nurses for clinical concerns.  If you have a medical emergency, go to the nearest  emergency room or call 911.  A surgeon from [facility] is always on call at the hospital   [address] [place], [place], [place]  [address] ?  [address], [place], [place]   [address] [phone] ? [phone] ? FAX [phone] Web site:  [email or web address]

## 2018-01-19 NOTE — Anesthesia Procedure Notes (Signed)
Procedure Name: LMA Insertion Date/Time: 01/19/2018 8:59 AM Performed by: Rachel MouldsLee, Marlaysia Lenig B, CRNA Pre-anesthesia Checklist: Patient identified, Emergency Drugs available, Suction available, Patient being monitored and Timeout performed Patient Re-evaluated:Patient Re-evaluated prior to induction Oxygen Delivery Method: Circle system utilized Preoxygenation: Pre-oxygenation with 100% oxygen Induction Type: IV induction LMA: LMA inserted LMA Size: 4.0 Number of attempts: 1 Placement Confirmation: positive ETCO2 and breath sounds checked- equal and bilateral Tube secured with: Tape Dental Injury: Teeth and Oropharynx as per pre-operative assessment

## 2018-01-20 ENCOUNTER — Encounter (HOSPITAL_COMMUNITY): Payer: Self-pay | Admitting: Surgery

## 2018-01-27 ENCOUNTER — Other Ambulatory Visit: Payer: Self-pay | Admitting: Women's Health

## 2018-01-27 DIAGNOSIS — Z1231 Encounter for screening mammogram for malignant neoplasm of breast: Secondary | ICD-10-CM

## 2018-03-04 ENCOUNTER — Ambulatory Visit
Admission: RE | Admit: 2018-03-04 | Discharge: 2018-03-04 | Disposition: A | Discharge status: Home / Self Care | Payer: BC Managed Care – PPO | Source: Ambulatory Visit | Attending: Women's Health | Admitting: Women's Health

## 2018-03-04 DIAGNOSIS — Z1231 Encounter for screening mammogram for malignant neoplasm of breast: Secondary | ICD-10-CM

## 2018-10-07 ENCOUNTER — Encounter: Payer: BC Managed Care – PPO | Admitting: Women's Health

## 2018-11-11 ENCOUNTER — Other Ambulatory Visit: Payer: Self-pay

## 2018-11-11 ENCOUNTER — Ambulatory Visit (INDEPENDENT_AMBULATORY_CARE_PROVIDER_SITE_OTHER): Payer: BC Managed Care – PPO | Admitting: Women's Health

## 2018-11-11 ENCOUNTER — Encounter: Payer: Self-pay | Admitting: Women's Health

## 2018-11-11 VITALS — BP 124/80 | Ht 66.0 in | Wt 167.0 lb

## 2018-11-11 DIAGNOSIS — Z01419 Encounter for gynecological examination (general) (routine) without abnormal findings: Secondary | ICD-10-CM | POA: Diagnosis not present

## 2018-11-11 DIAGNOSIS — Z13820 Encounter for screening for osteoporosis: Secondary | ICD-10-CM | POA: Diagnosis not present

## 2018-11-11 MED ORDER — ESTRADIOL 10 MCG VA TABS: ORAL_TABLET | VAGINAL | 4 refills | Status: DC

## 2018-11-11 NOTE — Patient Instructions (Signed)

## 2018-11-11 NOTE — Progress Notes (Signed)
Adrienne [name] 07-28-1960 [id number]    History:    Presents for annual exam.  Postmenopausal on no HRT with no bleeding with complaint of vaginal dryness.  Normal Pap and mammogram history.  History of endometriosis.  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    Had pneumonia vaccine in 2019.  2002 LSO for benign cyst.  Primary care manages hypertension, migraines, anxiety/depression.  Smoker less than half pack daily is hoping to quit with either the gum or patch.  2015 normal DEXA.  Past medical history, past surgical history, family history and social history were all reviewed and documented in the EPIC chart.  Engineer, technical sales.  Adopted.  ROS:  A ROS was performed and pertinent positives and negatives are included.  Exam:  Vitals:   11/11/18 1559  BP: 124/80  Weight: 167 lb (75.8 kg)  Height: 5\' 6"  (1.676 m)   Body mass index is 26.95 kg/m.   General appearance:  Normal Thyroid:  Symmetrical, normal in size, without palpable masses or nodularity. Respiratory  Auscultation:  Clear without wheezing or rhonchi Cardiovascular  Auscultation:  Regular rate, without rubs, murmurs or gallops  Edema/varicosities:  Not grossly evident Abdominal  Soft,nontender, without masses, guarding or rebound.  Liver/spleen:  No organomegaly noted  Hernia:  None appreciated  Skin  Inspection:  Grossly normal   Breasts: Examined lying and sitting.     Right: Without masses, retractions, discharge or axillary adenopathy.     Left: Without masses, retractions, discharge or axillary adenopathy. Gentitourinary   Inguinal/mons:  Normal without inguinal adenopathy  External genitalia:  Normal  BUS/Urethra/Skene's glands:  Normal  Vagina: Atrophic   Cervix:  Normal  Uterus:   normal in size, shape and contour.  Midline and mobile  Adnexa/parametria:     Rt: Without masses or tenderness.   Lt: Without masses or tenderness.  Anus and  perineum: Normal  Digital rectal exam: Normal sphincter tone without palpated masses or tenderness  Assessment/Plan:  58 y.o. MWF G1, P0 for annual exam with complaint of vaginal dryness.  Postmenopausal/no HRT/no bleeding 2019 multiple surgeries 2002 LSO for benign cyst Hypertension, migraines, anxiety/depression-primary care manages labs and meds Smoker less than half pack daily daily  Plan: Options for vaginal dryness reviewed, was prescribed Vagifem last year did not try due to warnings on drug handout, reviewed minimal systemic absorption, reviewed safety profile of Vagifem would like to try.  Prescription, proper use given and reviewed.  Reviews per vagina q. night for 2 weeks and then twice weekly thereafter.  SBEs, continue annual screening mammograms, calcium rich foods, vitamin D 2000 daily encouraged.  Reviewed importance of regular weightbearing and balance type exercise, home safety and fall prevention discussed.  Repeat DEXA, will schedule.  Pap per request Pap done.    [place], 5:04 PM 11/11/2018

## 2018-11-12 NOTE — Addendum Note (Signed)
Addended by: Alen Blew on: 11/12/2018 11:51 AM   Modules accepted: Orders

## 2018-11-16 LAB — PAP IG W/ RFLX HPV ASCU

## 2018-12-16 ENCOUNTER — Other Ambulatory Visit: Payer: Self-pay | Admitting: Women's Health

## 2018-12-16 ENCOUNTER — Other Ambulatory Visit: Payer: Self-pay

## 2018-12-16 ENCOUNTER — Ambulatory Visit (INDEPENDENT_AMBULATORY_CARE_PROVIDER_SITE_OTHER): Payer: BC Managed Care – PPO

## 2018-12-16 DIAGNOSIS — M85852 Other specified disorders of bone density and structure, left thigh: Secondary | ICD-10-CM

## 2018-12-16 DIAGNOSIS — M8588 Other specified disorders of bone density and structure, other site: Secondary | ICD-10-CM

## 2018-12-16 DIAGNOSIS — Z13820 Encounter for screening for osteoporosis: Secondary | ICD-10-CM

## 2018-12-16 DIAGNOSIS — Z780 Asymptomatic menopausal state: Secondary | ICD-10-CM

## 2019-04-11 ENCOUNTER — Ambulatory Visit: Payer: BC Managed Care – PPO | Attending: Internal Medicine

## 2019-04-18 ENCOUNTER — Ambulatory Visit: Payer: BC Managed Care – PPO | Attending: Internal Medicine

## 2019-04-18 DIAGNOSIS — Z23 Encounter for immunization: Secondary | ICD-10-CM

## 2019-04-18 NOTE — Progress Notes (Signed)
   Covid-19 Vaccination Clinic  Name:  Adrienne [name]    MRN: [id number] DOB: September 06, 1960  04/18/2019  [name] was observed post Covid-19 immunization for 15 minutes without incident. She was provided with Vaccine Information Sheet and instruction to access the V-Safe system.   [name] was instructed to call 911 with any severe reactions post vaccine: Marland Kitchen Difficulty breathing  . Swelling of face and throat  . A fast heartbeat  . A bad rash all over body  . Dizziness and weakness   Immunizations Administered    Name Date Dose VIS Date Route   Pfizer COVID-19 Vaccine 04/18/2019  5:40 PM 0.3 mL 01/22/2019 Intramuscular   Manufacturer: [employer], [employer]   Lot: [id number]   NDC: [address]-7

## 2019-05-09 ENCOUNTER — Ambulatory Visit: Payer: BC Managed Care – PPO | Attending: Internal Medicine

## 2019-05-09 DIAGNOSIS — Z23 Encounter for immunization: Secondary | ICD-10-CM

## 2019-05-09 NOTE — Progress Notes (Signed)
   Covid-19 Vaccination Clinic  Name:  Adrienne [name]    MRN: [id number] DOB: April 04, 1960  05/09/2019  [name] was observed post Covid-19 immunization for 15 minutes without incident. She was provided with Vaccine Information Sheet and instruction to access the V-Safe system.   [name] was instructed to call 911 with any severe reactions post vaccine: Marland Kitchen Difficulty breathing  . Swelling of face and throat  . A fast heartbeat  . A bad rash all over body  . Dizziness and weakness   Immunizations Administered    Name Date Dose VIS Date Route   Pfizer COVID-19 Vaccine 05/09/2019  3:15 PM 0.3 mL 01/22/2019 Intramuscular   Manufacturer: [employer], [employer]   Lot: [id number]   NDC: [address]-4

## 2019-07-21 ENCOUNTER — Other Ambulatory Visit: Payer: Self-pay | Admitting: Nurse Practitioner

## 2019-07-21 DIAGNOSIS — Z1231 Encounter for screening mammogram for malignant neoplasm of breast: Secondary | ICD-10-CM

## 2019-07-27 ENCOUNTER — Other Ambulatory Visit: Payer: Self-pay

## 2019-07-27 ENCOUNTER — Ambulatory Visit
Admission: RE | Admit: 2019-07-27 | Discharge: 2019-07-27 | Disposition: A | Discharge status: Home / Self Care | Payer: BC Managed Care – PPO | Source: Ambulatory Visit | Attending: Nurse Practitioner | Admitting: Nurse Practitioner

## 2019-07-27 DIAGNOSIS — Z1231 Encounter for screening mammogram for malignant neoplasm of breast: Secondary | ICD-10-CM

## 2019-08-23 IMAGING — CT CT ABD-PELV W/ CM
2 of 5 series · 15 of 46 positions shown, 17 images · IV contrast (ISOVUE)
Comparison: None.

CLINICAL DATA: Lower abdominal pain

EXAM:
CT ABDOMEN AND PELVIS WITH CONTRAST
TECHNIQUE: Multidetector CT imaging of the abdomen and pelvis was performed
using the standard protocol following bolus administration of
intravenous contrast.
CONTRAST:  100mL 8JZT1U-XMM IOPAMIDOL (8JZT1U-XMM) INJECTION 61%,
<See Chart> 8JZT1U-XMM IOPAMIDOL (8JZT1U-XMM) INJECTION 61%

[Series 2: axial st · axial · 0.95mm/px · z∈[-924,-519]mm · 12 of 95 slices shown, 14 images]
[im 7/95  soft-tissue]
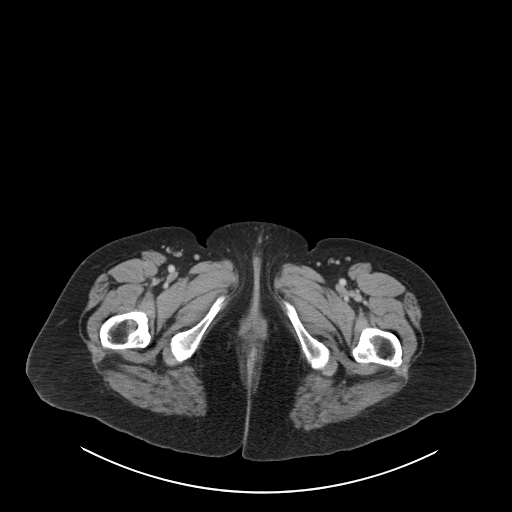
[im 7/95  bone]
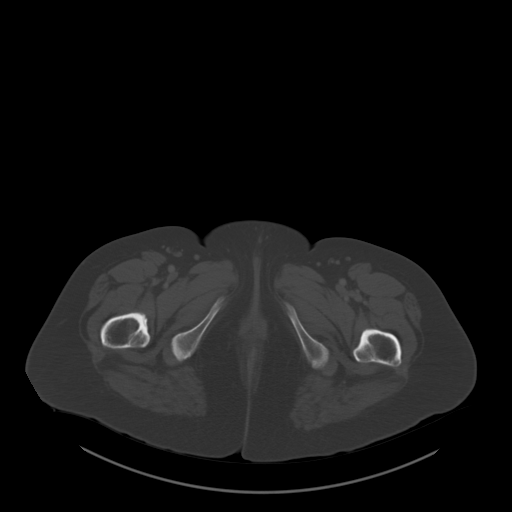
[im 13/95  soft-tissue]
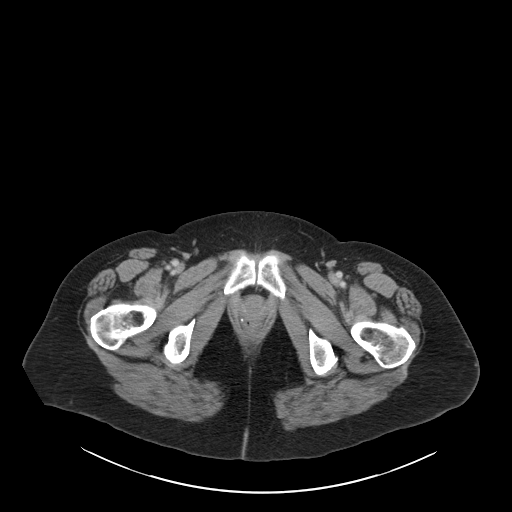
[im 19/95  soft-tissue]
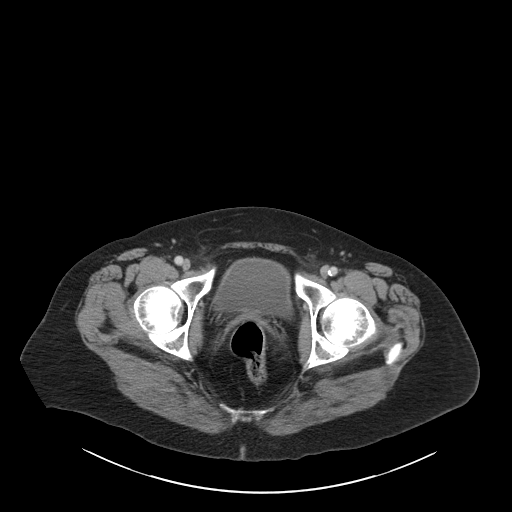
[im 32/95  soft-tissue]
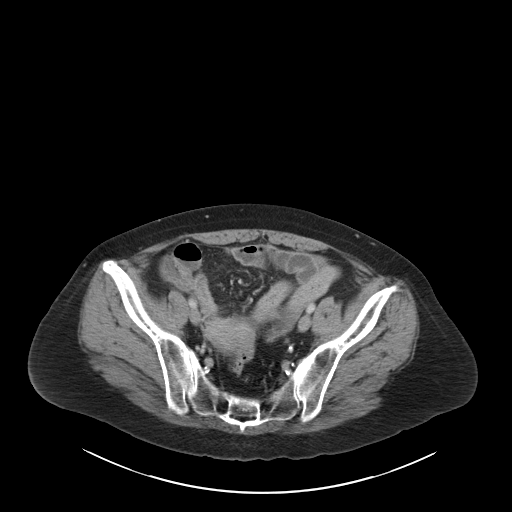
[im 38/95  soft-tissue]
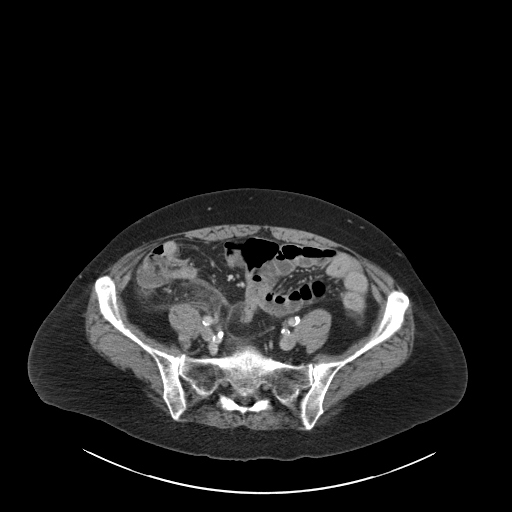
[im 44/95  soft-tissue]
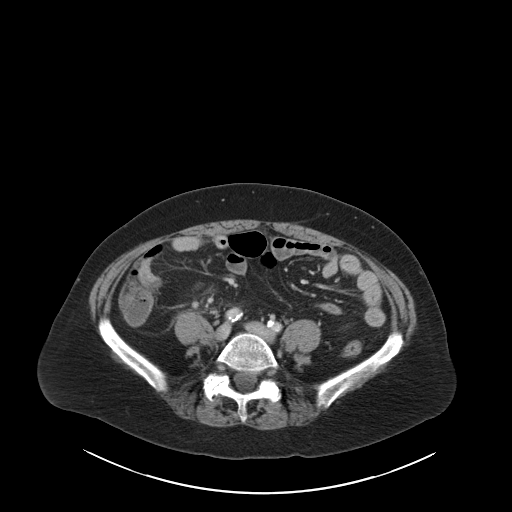
[im 51/95  soft-tissue]
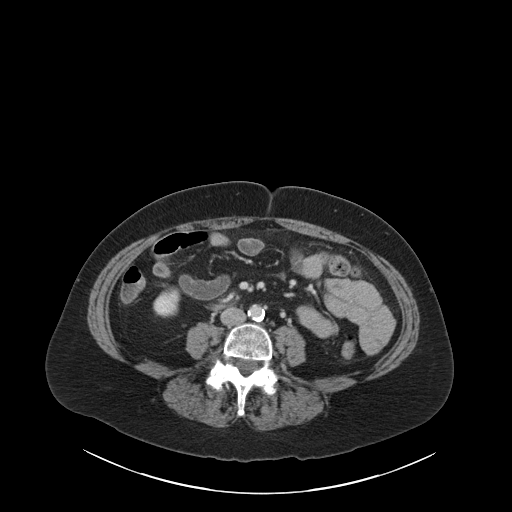
[im 57/95  soft-tissue]
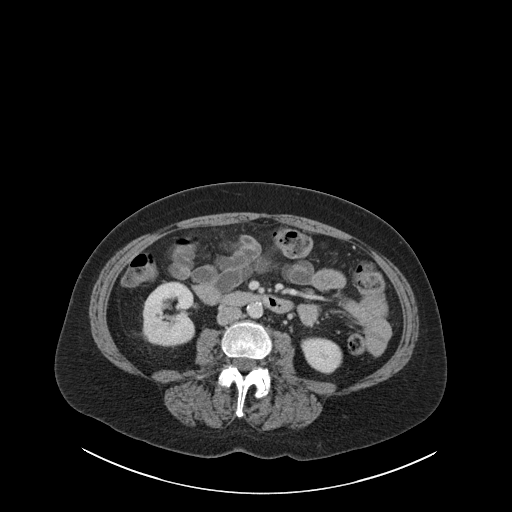
[im 63/95  soft-tissue]
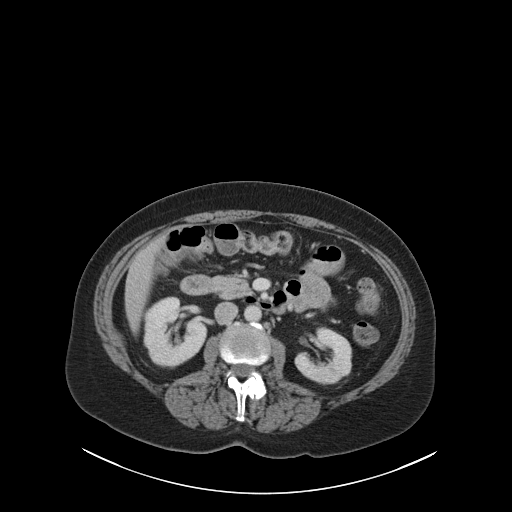
[im 63/95  bone]
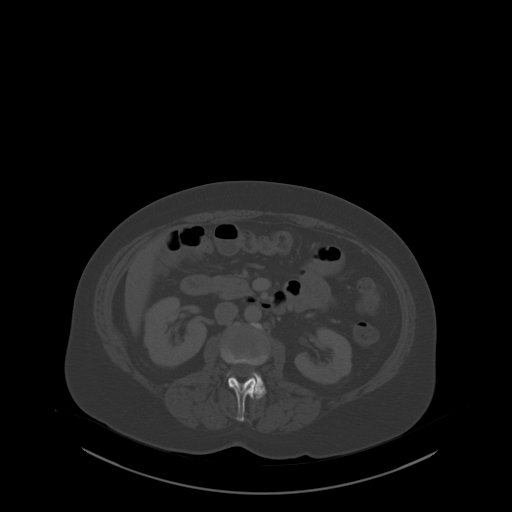
[im 76/95  soft-tissue]
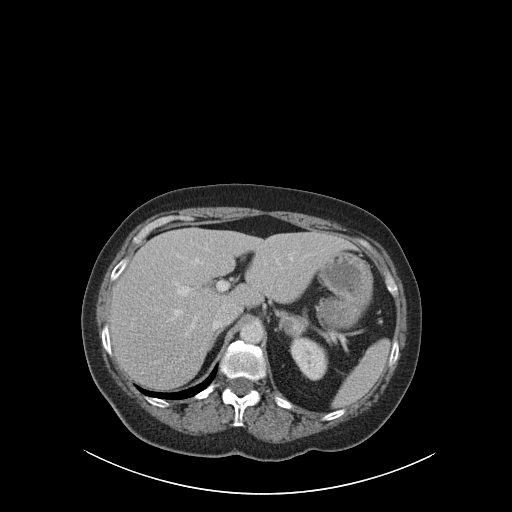
[im 82/95  soft-tissue]
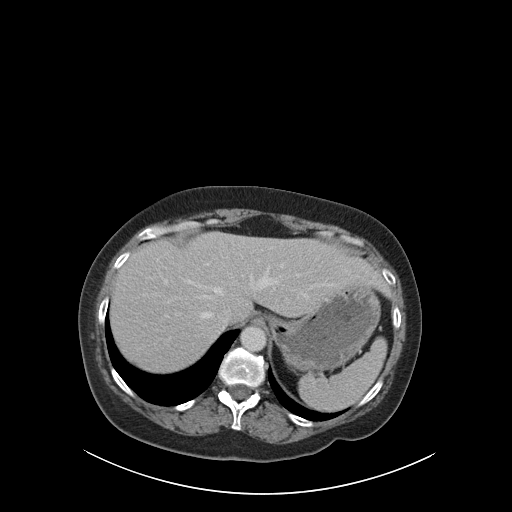
[im 88/95  soft-tissue]
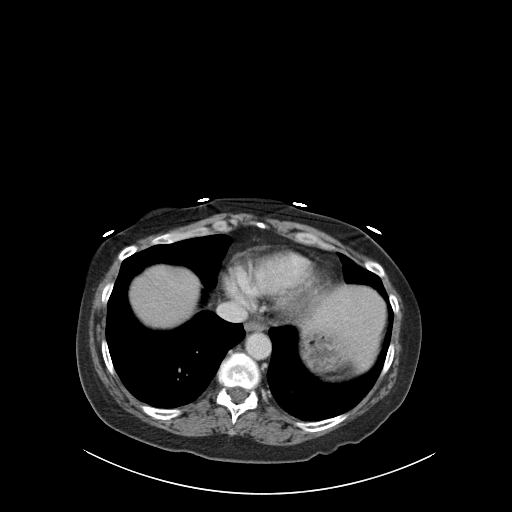

[Series 5: coronal st · coronal · 0.74mm/px · 3 of 94 slices shown]
[im 32/94  soft-tissue]
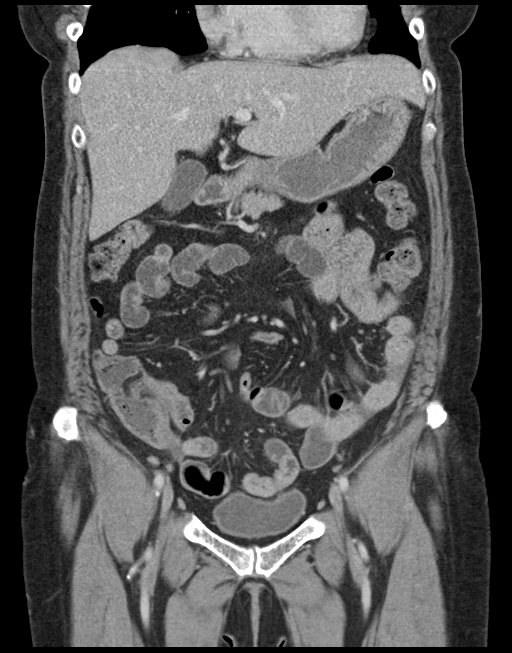
[im 42/94  soft-tissue]
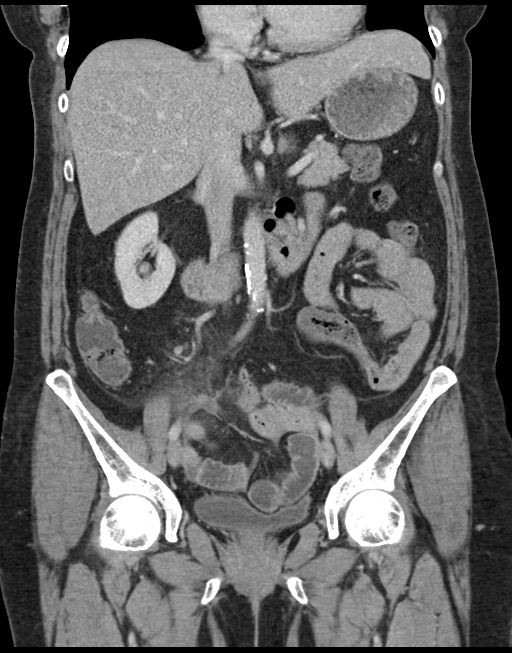
[im 52/94  soft-tissue]
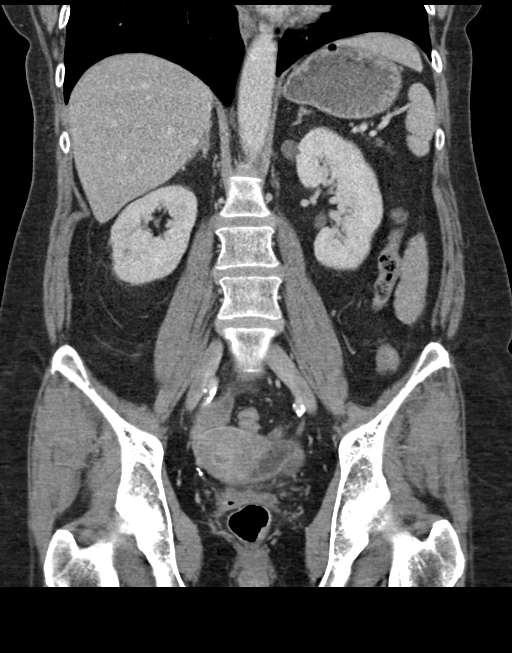

[15 of 46 positions shown; findings below may reference images not displayed]

FINDINGS: Lower chest: Lung bases demonstrate no acute consolidation or
effusion. Normal heart size.

Hepatobiliary: No focal liver abnormality is seen. No gallstones,
gallbladder wall thickening, or biliary dilatation.

Pancreas: Unremarkable. No pancreatic ductal dilatation or
surrounding inflammatory changes.

Spleen: Normal in size without focal abnormality.

Adrenals/Urinary Tract: Right adrenal gland is normal. 2.3 cm
indeterminate left adrenal mass. Exophytic 12 mm hypodensity upper
pole left kidney, slight increased density values. No
hydronephrosis. The bladder is normal

Stomach/Bowel: The stomach is nonenlarged. Thickened appearance of
right lower quadrant distal ileal loops with mucosal enhancement.
Appendix slightly enlarged, measuring up to 9 mm in size.
Considerable inflammation around the appendix with the tip
terminating in an inflammatory soft tissue density that is
contiguous with the right adnexa, series 2, image number 61. No
extraluminal gas. No stone.

Vascular/Lymphatic: Moderate aortic atherosclerosis. No aneurysmal
dilatation. No significantly enlarged lymph nodes.

Reproductive: Uterus unremarkable. Inflammatory changes in the right
adnexa with 19 mm inflammatory right adnexal mass.

Other: Negative for free air or free fluid.

Musculoskeletal: Trace anterolisthesis of L4 on L5. No acute or
suspicious lesion.
IMPRESSION: 1. Right lower quadrant inflammatory process. Appendix is slightly
enlarged up to 9 mm in size with enhancement present. The tip of the
appendix appears contiguous with a 19 mm inflammatory mass centered
in the right adnexa, adjacent to the right ovary. Differential
considerations include appendicitis with tip perforation and
adjacent phlegmon. Given close proximity to the adnexa/right ovary,
could also consider PID with secondary inflammation of the appendix
or other cause of inflammatory adnexal mass such as endometriosis.
No free air.

Appendix: Location: Right lower quadrant

Diameter: 9 mm

Appendicolith: Not seen

Mucosal hyper-enhancement: Present

Extraluminal gas: Not seen

Periappendiceal collection: No significant rim enhancing abscess.
Possible phlegmon adjacent to the tip of the appendix.

1. Thickened right lower quadrant distal ileal loops are likely
reactive
2. Indeterminate 2.3 cm left adrenal mass. Recommend further
evaluation with nonemergent adrenal CT.
3. 12 mm intermediate density arising from the upper pole of left
kidney. Could be further evaluated with multiphasic CT or MRI,
nonemergently.

## 2019-11-17 ENCOUNTER — Encounter: Payer: BC Managed Care – PPO | Admitting: Nurse Practitioner

## 2019-11-24 ENCOUNTER — Encounter: Payer: Self-pay | Admitting: Nurse Practitioner

## 2019-11-24 ENCOUNTER — Ambulatory Visit (INDEPENDENT_AMBULATORY_CARE_PROVIDER_SITE_OTHER): Payer: BC Managed Care – PPO | Admitting: Nurse Practitioner

## 2019-11-24 ENCOUNTER — Other Ambulatory Visit: Payer: Self-pay

## 2019-11-24 VITALS — BP 122/80 | Ht 66.0 in | Wt 168.0 lb

## 2019-11-24 DIAGNOSIS — Z23 Encounter for immunization: Secondary | ICD-10-CM | POA: Diagnosis not present

## 2019-11-24 DIAGNOSIS — N951 Menopausal and female climacteric states: Secondary | ICD-10-CM

## 2019-11-24 DIAGNOSIS — Z01419 Encounter for gynecological examination (general) (routine) without abnormal findings: Secondary | ICD-10-CM

## 2019-11-24 MED ORDER — ESTRADIOL 0.1 MG/GM VA CREA: 1.0000 | TOPICAL_CREAM | VAGINAL | 12 refills | Status: DC

## 2019-11-24 NOTE — Patient Instructions (Signed)

## 2019-11-24 NOTE — Progress Notes (Signed)
   Adrienne [name] [birth year] [id number]   History:  59 y.o. G1P0 presents for annual exam. Postmenopausal on Estrace vaginal tablet for atrophy and dryness. Says it has improved some but she still has painful intercourse. Using OTC lubricants with intercourse. 2002 LSO for benign cyst. Normal pap and mammogram history.   Gynecologic History Patient's last menstrual period was 03/29/2013.   Contraception: post menopausal status Last Pap: 11/12/2018. Results were: normal Last mammogram: 07/29/2019. Results were: normal Last colonoscopy: 07/27/2018. Results were: polyps, 5 year repeat recommended Last Dexa: 12/16/2018. Results were: t-score -1.8 at spine, 2 year follow up recommended  Past medical history, past surgical history, family history and social history were all reviewed and documented in the EPIC chart.  ROS:  A ROS was performed and pertinent positives and negatives are included.  Exam:  Vitals:   11/24/19 1611  BP: 122/80  Weight: 168 lb (76.2 kg)  Height: 5\' 6"  (1.676 m)   Body mass index is 27.12 kg/m.  General appearance:  Normal Thyroid:  Symmetrical, normal in size, without palpable masses or nodularity. Respiratory  Auscultation:  Clear without wheezing or rhonchi Cardiovascular  Auscultation:  Regular rate, without rubs, murmurs or gallops  Edema/varicosities:  Not grossly evident Abdominal  Soft,nontender, without masses, guarding or rebound.  Liver/spleen:  No organomegaly noted  Hernia:  Umbilical hernia present  Skin  Inspection:  Grossly normal   Breasts: Examined lying and sitting.   Right: Without masses, retractions, discharge or axillary adenopathy.   Left: Without masses, retractions, discharge or axillary adenopathy. Gentitourinary   Inguinal/mons:  Normal without inguinal adenopathy  External genitalia:  Normal  BUS/Urethra/Skene's glands:  Normal  Vagina:  Normal  Cervix:  Normal  Uterus:  Normal in size, shape and contour.  Midline and  mobile  Adnexa/parametria:     Rt: Without masses or tenderness.   Lt: Without masses or tenderness.  Anus and perineum: Normal  Digital rectal exam: Normal sphincter tone without palpated masses or tenderness  Assessment/Plan:  59 y.o. G1P0010 for annual exam.   Well female exam with routine gynecological exam - Plan: CBC with Differential/Platelet, Comprehensive metabolic panel. Education provided on SBEs, importance of preventative screenings, current guidelines, high calcium diet, regular exercise, and multivitamin daily.   Need for immunization against influenza - Plan: Flu Vaccine QUAD 36+ mos IM  Menopausal vaginal dryness - Plan: estradiol (ESTRACE VAGINAL) 0.1 MG/GM vaginal cream twice weekly. We will switch to creams to see if more effective. She is aware of minimal systemic absorption. Coconut oil for intercourse recommended.  Screening for cervical cancer -normal Pap history.  Will repeat next year per patient request.  Screening for breast cancer -normal mammogram history.  Normal breast exam today.  Screening for colon cancer - up to date on screening colonoscopy. Will follow up at their recommended interval.  Follow up in 1 year for annual.        [facility], 4:34 PM 11/24/2019

## 2019-11-26 LAB — CBC WITH DIFFERENTIAL/PLATELET
Absolute Monocytes: 961 cells/uL — ABNORMAL HIGH (ref 200–950)
Basophils Absolute: 89 cells/uL (ref 0–200)
Basophils Relative: 1 %
Eosinophils Absolute: 142 cells/uL (ref 15–500)
Eosinophils Relative: 1.6 %
HCT: 43.6 % (ref 35.0–45.0)
Hemoglobin: 15 g/dL (ref 11.7–15.5)
Lymphs Abs: 3916 cells/uL — ABNORMAL HIGH (ref 850–3900)
MCH: 35.9 pg — ABNORMAL HIGH (ref 27.0–33.0)
MCHC: 34.4 g/dL (ref 32.0–36.0)
MCV: 104.3 fL — ABNORMAL HIGH (ref 80.0–100.0)
MPV: 9.9 fL (ref 7.5–12.5)
Monocytes Relative: 10.8 %
Neutro Abs: 3791 cells/uL (ref 1500–7800)
Neutrophils Relative %: 42.6 %
Platelets: 396 10*3/uL (ref 140–400)
RBC: 4.18 10*6/uL (ref 3.80–5.10)
RDW: 11.8 % (ref 11.0–15.0)
Total Lymphocyte: 44 %
WBC: 8.9 10*3/uL (ref 3.8–10.8)

## 2019-11-26 LAB — COMPREHENSIVE METABOLIC PANEL
AG Ratio: 1.8 (calc) (ref 1.0–2.5)
ALT: 35 U/L — ABNORMAL HIGH (ref 6–29)
AST: 35 U/L (ref 10–35)
Albumin: 4.7 g/dL (ref 3.6–5.1)
Alkaline phosphatase (APISO): 63 U/L (ref 37–153)
BUN: 14 mg/dL (ref 7–25)
CO2: 27 mmol/L (ref 20–32)
Calcium: 10 mg/dL (ref 8.6–10.4)
Chloride: 95 mmol/L — ABNORMAL LOW (ref 98–110)
Creat: 0.87 mg/dL (ref 0.50–1.05)
Globulin: 2.6 g/dL (calc) (ref 1.9–3.7)
Glucose, Bld: 90 mg/dL (ref 65–99)
Potassium: 3.8 mmol/L (ref 3.5–5.3)
Sodium: 133 mmol/L — ABNORMAL LOW (ref 135–146)
Total Bilirubin: 0.6 mg/dL (ref 0.2–1.2)
Total Protein: 7.3 g/dL (ref 6.1–8.1)

## 2020-11-23 ENCOUNTER — Other Ambulatory Visit: Payer: Self-pay | Admitting: Nurse Practitioner

## 2020-11-23 DIAGNOSIS — Z1231 Encounter for screening mammogram for malignant neoplasm of breast: Secondary | ICD-10-CM

## 2020-11-27 ENCOUNTER — Ambulatory Visit: Payer: BC Managed Care – PPO | Admitting: Nurse Practitioner

## 2020-12-20 ENCOUNTER — Other Ambulatory Visit: Payer: Self-pay

## 2020-12-20 ENCOUNTER — Ambulatory Visit
Admission: RE | Admit: 2020-12-20 | Discharge: 2020-12-20 | Disposition: A | Discharge status: Home / Self Care | Payer: BC Managed Care – PPO | Source: Ambulatory Visit | Attending: Nurse Practitioner | Admitting: Nurse Practitioner

## 2020-12-20 DIAGNOSIS — Z1231 Encounter for screening mammogram for malignant neoplasm of breast: Secondary | ICD-10-CM

## 2021-01-15 ENCOUNTER — Ambulatory Visit: Payer: BC Managed Care – PPO | Admitting: Nurse Practitioner

## 2021-03-19 ENCOUNTER — Ambulatory Visit: Payer: BC Managed Care – PPO | Admitting: Nurse Practitioner

## 2021-06-18 ENCOUNTER — Ambulatory Visit: Payer: BC Managed Care – PPO | Admitting: Nurse Practitioner

## 2021-08-07 ENCOUNTER — Other Ambulatory Visit (HOSPITAL_COMMUNITY)
Admission: RE | Admit: 2021-08-07 | Discharge: 2021-08-07 | Disposition: A | Discharge status: Home / Self Care | Payer: BC Managed Care – PPO | Source: Ambulatory Visit | Attending: Nurse Practitioner | Admitting: Nurse Practitioner

## 2021-08-07 ENCOUNTER — Encounter: Payer: Self-pay | Admitting: Nurse Practitioner

## 2021-08-07 ENCOUNTER — Ambulatory Visit (INDEPENDENT_AMBULATORY_CARE_PROVIDER_SITE_OTHER): Payer: BC Managed Care – PPO | Admitting: Nurse Practitioner

## 2021-08-07 VITALS — BP 126/82 | Ht 66.0 in | Wt 169.0 lb

## 2021-08-07 DIAGNOSIS — N951 Menopausal and female climacteric states: Secondary | ICD-10-CM

## 2021-08-07 DIAGNOSIS — Z780 Asymptomatic menopausal state: Secondary | ICD-10-CM | POA: Diagnosis not present

## 2021-08-07 DIAGNOSIS — Z01419 Encounter for gynecological examination (general) (routine) without abnormal findings: Secondary | ICD-10-CM

## 2021-08-07 DIAGNOSIS — M85852 Other specified disorders of bone density and structure, left thigh: Secondary | ICD-10-CM | POA: Diagnosis not present

## 2021-08-07 MED ORDER — ESTRADIOL 0.1 MG/GM VA CREA: 1.0000 | TOPICAL_CREAM | VAGINAL | 3 refills | Status: DC

## 2021-08-08 LAB — CYTOLOGY - PAP
Adequacy: ABSENT
Comment: NEGATIVE
Diagnosis: NEGATIVE
High risk HPV: NEGATIVE

## 2021-08-15 ENCOUNTER — Ambulatory Visit (INDEPENDENT_AMBULATORY_CARE_PROVIDER_SITE_OTHER): Payer: BC Managed Care – PPO

## 2021-08-15 DIAGNOSIS — Z13820 Encounter for screening for osteoporosis: Secondary | ICD-10-CM

## 2021-08-15 DIAGNOSIS — Z780 Asymptomatic menopausal state: Secondary | ICD-10-CM

## 2021-08-15 DIAGNOSIS — M85852 Other specified disorders of bone density and structure, left thigh: Secondary | ICD-10-CM | POA: Diagnosis not present

## 2021-08-17 ENCOUNTER — Telehealth: Payer: Self-pay | Admitting: *Deleted

## 2021-08-17 DIAGNOSIS — R937 Abnormal findings on diagnostic imaging of other parts of musculoskeletal system: Secondary | ICD-10-CM

## 2021-08-17 NOTE — Telephone Encounter (Signed)
Adrienne [name] placed copy of dexa report on your desk to view [facility] message regarding MRI of right hip/upper femur. Please advise

## 2021-08-20 NOTE — Telephone Encounter (Signed)
Please order MRI of right hip for protuberance of lateral right hip seen on DXA. Thanks.

## 2021-08-20 NOTE — Telephone Encounter (Signed)
Patient informed. 

## 2021-08-20 NOTE — Telephone Encounter (Signed)
It is likely a benign finding such as an osteochondroma, but we want to rule out any malignant causes. Looks like this has been seen on previous DXA reports since 2015.

## 2021-08-20 NOTE — Telephone Encounter (Signed)
Patient said she concerned about the protuberance of right hip. She would like to know what this could be?

## 2021-08-20 NOTE — Telephone Encounter (Signed)
Tiffany I am not sure what diagnosis to associate MRI. I have put all the information in order. If you could sign with diagnosis. I have spoke with patient already.

## 2021-08-20 NOTE — Telephone Encounter (Signed)
Signed. Thank you.

## 2021-09-11 ENCOUNTER — Ambulatory Visit
Admission: RE | Admit: 2021-09-11 | Discharge: 2021-09-11 | Disposition: A | Discharge status: Home / Self Care | Payer: BC Managed Care – PPO | Source: Ambulatory Visit | Attending: Nurse Practitioner | Admitting: Nurse Practitioner

## 2021-09-11 DIAGNOSIS — R937 Abnormal findings on diagnostic imaging of other parts of musculoskeletal system: Secondary | ICD-10-CM

## 2021-09-11 MED ORDER — GADOBENATE DIMEGLUMINE 529 MG/ML IV SOLN
15.0000 mL | Freq: Once | INTRAVENOUS | Status: AC | PRN
  Administered 2021-09-11: 15 mL via INTRAVENOUS

## 2022-06-12 ENCOUNTER — Other Ambulatory Visit: Payer: Self-pay | Admitting: Family Medicine

## 2022-06-12 DIAGNOSIS — Z1231 Encounter for screening mammogram for malignant neoplasm of breast: Secondary | ICD-10-CM

## 2022-07-22 ENCOUNTER — Ambulatory Visit
Admission: RE | Admit: 2022-07-22 | Discharge: 2022-07-22 | Disposition: A | Discharge status: Home / Self Care | Payer: BC Managed Care – PPO | Source: Ambulatory Visit | Attending: Family Medicine | Admitting: Family Medicine

## 2022-07-22 DIAGNOSIS — Z1231 Encounter for screening mammogram for malignant neoplasm of breast: Secondary | ICD-10-CM

## 2022-08-07 ENCOUNTER — Other Ambulatory Visit: Payer: Self-pay | Admitting: Nurse Practitioner

## 2022-08-07 DIAGNOSIS — N951 Menopausal and female climacteric states: Secondary | ICD-10-CM

## 2022-08-07 NOTE — Telephone Encounter (Signed)
Med refill request: Estrace Last AEX: 08/07/21 Next AEX: 08/11/21 w TW Last MMG (if hormonal med) 07/22/22 Refill authorized: Please Advise, #42.5, 3 RF

## 2022-08-12 ENCOUNTER — Ambulatory Visit: Payer: BC Managed Care – PPO | Admitting: Nurse Practitioner

## 2023-07-14 ENCOUNTER — Other Ambulatory Visit: Payer: Self-pay | Admitting: Family Medicine

## 2023-07-14 DIAGNOSIS — Z1231 Encounter for screening mammogram for malignant neoplasm of breast: Secondary | ICD-10-CM

## 2023-07-23 ENCOUNTER — Ambulatory Visit: Payer: Self-pay

## 2023-08-05 ENCOUNTER — Ambulatory Visit
Admission: RE | Admit: 2023-08-05 | Discharge: 2023-08-05 | Disposition: A | Discharge status: Home / Self Care | Payer: Self-pay | Source: Ambulatory Visit | Attending: Family Medicine | Admitting: Family Medicine

## 2023-08-05 DIAGNOSIS — Z1231 Encounter for screening mammogram for malignant neoplasm of breast: Secondary | ICD-10-CM
